# Patient Record
Sex: Female | Born: 1995
Health system: Southern US, Community
[De-identification: ages and names within clinical notes are randomized; demographics above are authoritative.]

## PROBLEM LIST (undated history)

## (undated) DIAGNOSIS — A6000 Herpesviral infection of urogenital system, unspecified: Secondary | ICD-10-CM

## (undated) DIAGNOSIS — N760 Acute vaginitis: Secondary | ICD-10-CM

## (undated) DIAGNOSIS — T7421XA Adult sexual abuse, confirmed, initial encounter: Secondary | ICD-10-CM

## (undated) DIAGNOSIS — B9689 Other specified bacterial agents as the cause of diseases classified elsewhere: Secondary | ICD-10-CM

## (undated) DIAGNOSIS — F419 Anxiety disorder, unspecified: Secondary | ICD-10-CM

## (undated) DIAGNOSIS — K259 Gastric ulcer, unspecified as acute or chronic, without hemorrhage or perforation: Secondary | ICD-10-CM

## (undated) DIAGNOSIS — L309 Dermatitis, unspecified: Secondary | ICD-10-CM

## (undated) DIAGNOSIS — M797 Fibromyalgia: Secondary | ICD-10-CM

## (undated) DIAGNOSIS — M419 Scoliosis, unspecified: Secondary | ICD-10-CM

## (undated) DIAGNOSIS — G629 Polyneuropathy, unspecified: Secondary | ICD-10-CM

## (undated) DIAGNOSIS — F32A Depression, unspecified: Secondary | ICD-10-CM

## (undated) HISTORY — DX: Depression, unspecified: F32.A

## (undated) HISTORY — DX: Anxiety disorder, unspecified: F41.9

## (undated) HISTORY — DX: Dermatitis, unspecified: L30.9

## (undated) HISTORY — PX: KNEE SURGERY: SHX244

---
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
## 2015-01-09 ENCOUNTER — Emergency Department (HOSPITAL_COMMUNITY)
Admission: EM | Admit: 2015-01-09 | Discharge: 2015-01-09 | Disposition: A | Discharge status: Home / Self Care | Payer: Medicaid Other | Attending: Emergency Medicine | Admitting: Emergency Medicine

## 2015-01-09 ENCOUNTER — Encounter (HOSPITAL_COMMUNITY): Payer: Self-pay | Admitting: Emergency Medicine

## 2015-01-09 DIAGNOSIS — Z79899 Other long term (current) drug therapy: Secondary | ICD-10-CM | POA: Diagnosis not present

## 2015-01-09 DIAGNOSIS — Z720 Tobacco use: Secondary | ICD-10-CM | POA: Insufficient documentation

## 2015-01-09 DIAGNOSIS — Z791 Long term (current) use of non-steroidal anti-inflammatories (NSAID): Secondary | ICD-10-CM | POA: Insufficient documentation

## 2015-01-09 DIAGNOSIS — M79671 Pain in right foot: Secondary | ICD-10-CM | POA: Diagnosis present

## 2015-01-09 DIAGNOSIS — M79672 Pain in left foot: Secondary | ICD-10-CM | POA: Insufficient documentation

## 2015-01-09 LAB — CBG MONITORING, ED: Glucose-Capillary: 92 mg/dL (ref 70–99)

## 2015-01-09 NOTE — ED Provider Notes (Signed)
CSN: [id number]     Arrival date & time 01/09/15  0910 History   First MD Initiated Contact with Patient 01/09/15 [phone]     Chief Complaint  Patient presents with  . Foot Pain     (Consider location/radiation/quality/duration/timing/severity/associated sxs/prior Treatment) HPI   19 year old female with intermittent and ongoing pain in both feet over the past 2 months. Patient describes pain in the ball of either foot. Comes and goes. No appreciable relieving factors. When she has the pain, it is worse with bearing weight. Prolonged activity doesn't necessarily precipitated though. No swelling. No numbness or tingling.  History reviewed. No pertinent past medical history. Past Surgical History  Procedure Laterality Date  . Knee surgery     Family History  Problem Relation Age of Onset  . Heart failure Mother   . Diabetes Mother   . Hypertension Mother   . Cancer Other    History  Substance Use Topics  . Smoking status: Current Some Day Smoker    Types: Cigarettes  . Smokeless tobacco: Never Used  . Alcohol Use: No   OB History    No data available     Review of Systems  All systems reviewed and negative, other than as noted in HPI.   Allergies  Review of patient's allergies indicates no known allergies.  Home Medications   Prior to Admission medications   Medication Sig Start Date End Date Taking? Authorizing Provider  acetaminophen (TYLENOL) 500 MG tablet Take 500 mg by mouth every 6 (six) hours as needed for headache.    Historical Provider, MD  butalbital-acetaminophen-caffeine (FIORICET, ESGIC) 50-325-40 MG per tablet Take 1 tablet by mouth 2 (two) times daily as needed for headache (headache).    Historical Provider, MD  HYDROcodone-acetaminophen (NORCO/VICODIN) 5-325 MG per tablet Take 1-2 tablets every 6 hours as needed for severe pain 11/23/14   Renne CriglerJoshua Geiple, PA-C  medroxyPROGESTERone (DEPO-PROVERA) 150 MG/ML injection Inject 150 mg into the muscle every 3  (three) months.    Historical Provider, MD  methocarbamol (ROBAXIN) 500 MG tablet Take 1 tablet (500 mg total) by mouth 4 (four) times daily. 11/23/14   Renne CriglerJoshua Geiple, PA-C  naproxen (NAPROSYN) 500 MG tablet Take 1 tablet (500 mg total) by mouth 2 (two) times daily. 12/27/14   Dione Boozeavid Glick, MD  Prenatal Vit-Fe Fumarate-FA (PRENATAL MULTIVITAMIN) TABS tablet Take 1 tablet by mouth daily at 12 noon.    Historical Provider, MD  valACYclovir (VALTREX) 1000 MG tablet Take 1,000 mg by mouth daily. as directed 12/10/14   Historical Provider, MD   There were no vitals taken for this visit. Physical Exam  Constitutional: She appears well-developed and well-nourished. No distress.  HENT:  Head: Normocephalic and atraumatic.  Eyes: Conjunctivae are normal. Right eye exhibits no discharge. Left eye exhibits no discharge.  Neck: Neck supple.  Cardiovascular: Normal rate, regular rhythm and normal heart sounds.  Exam reveals no gallop and no friction rub.   No murmur heard. Pulmonary/Chest: Effort normal and breath sounds normal. No respiratory distress.  Abdominal: Soft. She exhibits no distension. There is no tenderness.  Musculoskeletal: She exhibits no edema or tenderness.  Patient wearing what appears to be ballet slippers. Feet are grossly normal in appearance. No concerning skin changes or swelling. I cannot elicit any tenderness with palpation of either foot or ankle. No apparent pain with range of motion. Palpable DP pulses bilaterally. Feet are warm to touch. Sensation is intact to light touch. Brisk cap refill in toes.  Neurological:  She is alert.  Skin: Skin is warm and dry.  Psychiatric: She has a normal mood and affect. Her behavior is normal. Thought content normal.  Nursing note and vitals reviewed.   ED Course  Procedures (including critical care time) Labs Review Labs Reviewed  CBG MONITORING, ED    Imaging Review No results found.   EKG Interpretation None      MDM   Final  diagnoses:  Foot pain, bilateral    19yF with intermittent b/l foot pain. No acute trauma. Exam unremarkable. NVI. Imaging likely very low yield. Pt wearing foot wear similar to ballet slippers. Recommended footwear with good support, PRN NSAIDs, avoiding repetitive activities which can potentially aggravate, etc. Also addressed what to me is concerning pattern in terms of receiving controlled pain medications. Large quantities. Several different providers. Getting new prescription soon after filling another. Patient subsequently caled her mother who requested to speak with me. Her mother subsequently yelled at me. I tried explaining I addressed this as a concern and not an accusation of any kind. Mother remained upset and requested my name so she could complain.     Raeford Razor, MD 01/11/15 [phone]

## 2015-01-09 NOTE — ED Notes (Signed)
Per pt states left foot pain, ball of foot-going on for months-has been seen for it

## 2015-07-30 ENCOUNTER — Emergency Department (HOSPITAL_COMMUNITY)
Admission: EM | Admit: 2015-07-30 | Discharge: 2015-07-30 | Disposition: A | Discharge status: Home / Self Care | Payer: Medicaid Other | Attending: Emergency Medicine | Admitting: Emergency Medicine

## 2015-07-30 ENCOUNTER — Emergency Department (HOSPITAL_COMMUNITY): Payer: Medicaid Other

## 2015-07-30 ENCOUNTER — Encounter (HOSPITAL_COMMUNITY): Payer: Self-pay | Admitting: Family Medicine

## 2015-07-30 DIAGNOSIS — M25512 Pain in left shoulder: Secondary | ICD-10-CM | POA: Insufficient documentation

## 2015-07-30 DIAGNOSIS — M25561 Pain in right knee: Secondary | ICD-10-CM | POA: Insufficient documentation

## 2015-07-30 DIAGNOSIS — G8929 Other chronic pain: Secondary | ICD-10-CM | POA: Diagnosis not present

## 2015-07-30 DIAGNOSIS — Z3202 Encounter for pregnancy test, result negative: Secondary | ICD-10-CM | POA: Diagnosis not present

## 2015-07-30 DIAGNOSIS — M549 Dorsalgia, unspecified: Secondary | ICD-10-CM | POA: Insufficient documentation

## 2015-07-30 DIAGNOSIS — R52 Pain, unspecified: Secondary | ICD-10-CM

## 2015-07-30 DIAGNOSIS — Z720 Tobacco use: Secondary | ICD-10-CM | POA: Diagnosis not present

## 2015-07-30 DIAGNOSIS — M25511 Pain in right shoulder: Secondary | ICD-10-CM | POA: Diagnosis not present

## 2015-07-30 HISTORY — DX: Fibromyalgia: M79.7

## 2015-07-30 LAB — POC URINE PREG, ED: Preg Test, Ur: NEGATIVE

## 2015-07-30 LAB — CBG MONITORING, ED: Glucose-Capillary: 116 mg/dL — ABNORMAL HIGH (ref 65–99)

## 2015-07-30 MED ORDER — HYDROCODONE-ACETAMINOPHEN 5-325 MG PO TABS
1.0000 | ORAL_TABLET | Freq: Once | ORAL | Status: AC
  Administered 2015-07-30: 1 via ORAL
  Filled 2015-07-30: qty 1

## 2015-07-30 MED ORDER — METHOCARBAMOL 500 MG PO TABS
500.0000 mg | ORAL_TABLET | Freq: Once | ORAL | Status: AC
  Administered 2015-07-30: 500 mg via ORAL
  Filled 2015-07-30: qty 1

## 2015-07-30 MED ORDER — MELOXICAM 7.5 MG PO TABS: 7.5000 mg | ORAL_TABLET | Freq: Every day | ORAL | Status: DC

## 2015-07-30 NOTE — Discharge Instructions (Signed)
Cryotherapy °Cryotherapy means treatment with cold. Ice or gel packs can be used to reduce both pain and swelling. Ice is the most helpful within the first 24 to 48 hours after an injury or flare-up from overusing a muscle or joint. Sprains, strains, spasms, burning pain, shooting pain, and aches can all be eased with ice. Ice can also be used when recovering from surgery. Ice is effective, has very few side effects, and is safe for most people to use. °PRECAUTIONS  °Ice is not a safe treatment option for people with: °· Raynaud phenomenon. This is a condition affecting small blood vessels in the extremities. Exposure to cold may cause your problems to return. °· Cold hypersensitivity. There are many forms of cold hypersensitivity, including: °¨ Cold urticaria. Red, itchy hives appear on the skin when the tissues begin to warm after being iced. °¨ Cold erythema. This is a red, itchy rash caused by exposure to cold. °¨ Cold hemoglobinuria. Red blood cells break down when the tissues begin to warm after being iced. The hemoglobin that carry oxygen are passed into the urine because they cannot combine with blood proteins fast enough. °· Numbness or altered sensitivity in the area being iced. °If you have any of the following conditions, do not use ice until you have discussed cryotherapy with your caregiver: °· Heart conditions, such as arrhythmia, angina, or chronic heart disease. °· High blood pressure. °· Healing wounds or open skin in the area being iced. °· Current infections. °· Rheumatoid arthritis. °· Poor circulation. °· Diabetes. °Ice slows the blood flow in the region it is applied. This is beneficial when trying to stop inflamed tissues from spreading irritating chemicals to surrounding tissues. However, if you expose your skin to cold temperatures for too long or without the proper protection, you can damage your skin or nerves. Watch for signs of skin damage due to cold. °HOME CARE INSTRUCTIONS °Follow  these tips to use ice and cold packs safely. °· Place a dry or damp towel between the ice and skin. A damp towel will cool the skin more quickly, so you may need to shorten the time that the ice is used. °· For a more rapid response, add gentle compression to the ice. °· Ice for no more than 10 to 20 minutes at a time. The bonier the area you are icing, the less time it will take to get the benefits of ice. °· Check your skin after 5 minutes to make sure there are no signs of a poor response to cold or skin damage. °· Rest 20 minutes or more between uses. °· Once your skin is numb, you can end your treatment. You can test numbness by very lightly touching your skin. The touch should be so light that you do not see the skin dimple from the pressure of your fingertip. When using ice, most people will feel these normal sensations in this order: cold, burning, aching, and numbness. °· Do not use ice on someone who cannot communicate their responses to pain, such as small children or people with dementia. °HOW TO MAKE AN ICE PACK °Ice packs are the most common way to use ice therapy. Other methods include ice massage, ice baths, and cryosprays. Muscle creams that cause a cold, tingly feeling do not offer the same benefits that ice offers and should not be used as a substitute unless recommended by your caregiver. °To make an ice pack, do one of the following: °· Place crushed ice or a   bag of frozen vegetables in a sealable plastic bag. Squeeze out the excess air. Place this bag inside another plastic bag. Slide the bag into a pillowcase or place a damp towel between your skin and the bag.  Mix 3 parts water with 1 part rubbing alcohol. Freeze the mixture in a sealable plastic bag. When you remove the mixture from the freezer, it will be slushy. Squeeze out the excess air. Place this bag inside another plastic bag. Slide the bag into a pillowcase or place a damp towel between your skin and the bag. SEEK MEDICAL CARE  IF:  You develop white spots on your skin. This may give the skin a blotchy (mottled) appearance.  Your skin turns blue or pale.  Your skin becomes waxy or hard.  Your swelling gets worse. MAKE SURE YOU:   Understand these instructions.  Will watch your condition.  Will get help right away if you are not doing well or get worse.   This information is not intended to replace advice given to you by your health care provider. Make sure you discuss any questions you have with your health care provider.   Document Released: 05/25/2011 Document Revised: 10/19/2014 Document Reviewed: 05/25/2011 Elsevier Interactive Patient Education [employer]  Emergency Department Resource Guide 1) Find a Doctor and Pay Out of Pocket Although you won't have to find out who is covered by your insurance plan, it is a good idea to ask around and get recommendations. You will then need to call the office and see if the doctor you have chosen will accept you as a new patient and what types of options they offer for patients who are self-pay. Some doctors offer discounts or will set up payment plans for their patients who do not have insurance, but you will need to ask so you aren't surprised when you get to your appointment.  2) Contact Your Local Health Department Not all health departments have doctors that can see patients for sick visits, but many do, so it is worth a call to see if yours does. If you don't know where your local health department is, you can check in your phone book. The CDC also has a tool to help you locate your state's health department, and many state websites also have listings of all of their local health departments.  3) Find a Walk-in Clinic If your illness is not likely to be very severe or complicated, you may want to try a walk in clinic. These are popping up all over the country in pharmacies, drugstores, and shopping centers. They're usually staffed by nurse practitioners  or physician assistants that have been trained to treat common illnesses and complaints. They're usually fairly quick and inexpensive. However, if you have serious medical issues or chronic medical problems, these are probably not your best option.  No Primary Care Doctor: - Call Health Connect at  [phone] - they can help you locate a primary care doctor that  accepts your insurance, provides certain services, etc. - Physician Referral Service- [phone]  Chronic Pain Problems: Organization         Address  Phone   Notes  [facility]  [phone] Patients need to be referred by their primary care doctor.   Medication Assistance: Organization         Address  Phone   Notes  [facility] Medication [facility] [address] [place] [place], [place] [address] [phone] --Must be a  resident of [place] -- Must have NO insurance coverage whatsoever (no Medicaid/ Medicare, etc.) -- The pt. MUST have a primary care doctor that directs their care regularly and follows them in the community   [facility]ist  [phone]   [employer]  [phone]    Agencies that provide inexpensive medical care: Organization         Address  Phone   Notes  [facility]  [phone]) [phone]   [facility]    [phone]) [phone]   [facility] [address] [place], [place] [id number] [phone]   [facility] [place] [address], [place] [phone]   [facility]anned Parenthood    [phone]   [facility]    [phone]   [facility]  [address], [place] Phone:  [phone]) [phone], Fax:  [phone] Hours of Operation:  9 am - 6 pm, M-F.  Also accepts Medicaid/Medicare and self-pay.  [facility]  [address], [place] Phone: [phone], Fax: [phone]. Hours of Operation:  8:30 am - 5:30 pm, M-F.   Also accepts Medicaid and self-pay.  [facility] [place] [address], [place] Phone: [phone]) [phone]   [facility] [address], [place], [place] [phone], Ext. [phone] Mondays & Thursdays: 7-9 AM.  First 15 patients are seen on a first come, first serve basis.    Medicaid-accepting [facility] Providers:  Organization         Address  Phone   Notes  [facility] [address], [place] [phone] Also accepts self-pay patients.  [facility] [address], [place]  [phone]) [phone]   [facility] [address], [place] [phone]) [phone]   [facility] [address] [place] [address], [place] [phone]) [phone]   Renaye RakersVeita Bland [address], [place]   [phone] Only accepts [place] Access [place] patients after they have their name applied to their card.   Self-Pay (no insurance) in [place]:  Organization         Address  Phone   Notes  Sickle Cell Patients, [facility] [address], [place] [phone]) [phone]   [facility] [address], [place] [phone]) [phone]   [facility] [place]  [address], [place] [phone]) [phone]   [facility]/Dr. Osei-Bonsu  [address] [place] [address], [place] or [phone], [place] [phone] Phone number for both [place] and [place] locations is the same.  [facility] [address], [place] [phone]) [phone]   [facility] [address] [place] [address], [place] or [address] [phone]   [facility] [address], [place] [phone]) [phone], phone; [phone]) [phone], fax Sees patients 1st and 3rd Saturday of every month.  Must not qualify for public or private insurance (i.e. Medicaid, Medicare, [place] Health Choice, Veterans'  Benefits)  Household income should be no more than 200% of the poverty level The clinic cannot treat you if you are pregnant or think you are pregnant  Sexually transmitted diseases are not treated at the clinic.    Dental Care: Organization  Address  Phone  Notes  [facility] [address] [place], [place] [phone] Accepts children up to age 66 who are enrolled in [place] or [place] Health Choice; pregnant women with a Medicaid card; and children who have applied for Medicaid or [place] Health Choice, but were declined, whose parents can pay a reduced fee at time of service.  [facility]  [address], [place] [phone] Accepts children up to age 59 who are enrolled in [place] or [place] Health Choice; pregnant women with a Medicaid card; and children who have applied for Medicaid or  Health Choice, but were declined, whose parents can pay a reduced fee at time of service.  [facility]  [address] [place], [place] [phone] Patients are seen by appointment only. Walk-ins are not accepted. [facility] will see patients 64 years of age and older. Monday - Tuesday (8am-5pm) Most Wednesdays (8:30-5pm) $30 per visit, cash only  [facility] Adult [facility] Access [facility]  [address], [facility] [phone] Patients are seen by appointment only. Walk-ins are not accepted. [facility] will see patients 51 years of age and older. One Wednesday Evening (Monthly: Volunteer Based).  $30 per visit, cash only  [employer]  [phone] for adults; Children under age 21, call Graduate Pediatric Dentistry at [phone]. Children aged 55-14, please call [phone] to request a pediatric application.  Dental services are provided in all areas of dental care including fillings, crowns and bridges, complete and partial  dentures, implants, gum treatment, root canals, and extractions. Preventive care is also provided. Treatment is provided to both adults and children. Patients are selected via a lottery and there is often a waiting list.   [facility] [address] [place]  [phone] [email or web address]   [facility] [address] [place], [place] [phone], Ext. [phone] Second and Fourth Thursday of each month, opens at 6:30 AM; Clinic ends at 9 AM.  Patients are seen on a first-come first-served basis, and a limited number are seen during each clinic.   [facility]  [address] [place], [place] [phone]   Eligibility Requirements You must have lived in [place], [place], or [place] counties for at least the last three months.   You cannot be eligible for state or federal sponsored [place], including [employer], [place], or [employer].   You generally cannot be eligible for healthcare insurance through your employer.    How to apply: Eligibility screenings are held every Tuesday and Wednesday afternoon from 1:00 pm until 4:00 pm. You do not need an appointment for the interview!  [facility] [address] [place], [place] [phone]   [facility] Health Department  [phone]   [facility] Health Department  [phone]   [facility] Health Department  [phone]    Behavioral Health Resources in the Community: Intensive Outpatient Programs Organization         Address  Phone  Notes  [facility] [address] [place], [place] [phone]   [facility] Outpatient [address], [place], [place] [phone]   [facility] [address], [place], [place]  [phone]   [facility] [address],  [place], [place] [phone] or [phone]   Substance Abuse Resources Organization  Address  Phone  Notes  [facility]  [phone]   [facility]  [phone]   [facility] [place]  [phone]   Floydene Flock  [phone]   Residential & Outpatient Substance Abuse Program  [phone]   Psychological Services Organization         Address  Phone  Notes  [facility] Behavioral Health  [phone]   [facility] Services  [phone]   [facility] [address] [place] [phone] or [phone]    Mobile Crisis Teams Organization         Address  Phone  Notes  [facility], Mobile Crisis Care Unit  [phone]   [facility]  [address] [place], [place] [phone]   Doristine Locks [address] [place] [phone]    Self-Help/Support Groups Organization         Address  Phone             Notes  [facility] [place] - variety of support groups  [phone]- I7437963 Call for more information  Narcotics Anonymous (NA), [facility] [address], [employer] [place]  2 meetings at this location   Statistician         Address  Phone  Notes  [facility] [address],    [place]  [phone]   [facility]  [address] [place] [address], [place], [place] [phone]   [facility] [address] [place], [place] [phone] Admissions: 8am-3pm M-F  [facility] [address]    [place], [place] [phone]   [facility] [address] [place], [place], [place] [phone]   [facility] [address]  [place], [place] [phone]   [facility] - Intensive Outpatient [address], [place], [place] [phone]   [facility] ([facility] [address] [place]  [place], [place] [phone] or [phone]   [facility] ([facility]) [address] [place], [place]  [phone] Accepts Medicaid  [facility] [place] [facility] [address]  [place] [phone] Substance Abuse/Addiction Treatment   [facility] Organization         Address  Phone  Notes  [facility]  [phone]   Angie Fava, PhD [address] [place], [place]   [phone] or [phone]   [facility]   [address] [place], [place] [phone]   [facility] [address] [place], [place] [phone] Insurance/Medicaid/sponsorship through [facility] and Families [address]                                    [place], [place] [phone] Therapy/tele-psych/case  [facility] [address], [place] [phone]    Dr. Lolly Mustache  [phone]   [facility] [place]  [employer] [facility] Dept. 1) [address], [place] 2) [address] [place] 3)  [address], [place] [phone]  [phone]   [facility] Child Abuse Hotline 4696492381 or [phone] (After Hours)
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
## 2015-11-02 ENCOUNTER — Encounter (HOSPITAL_COMMUNITY): Payer: Self-pay | Admitting: Emergency Medicine

## 2015-11-02 ENCOUNTER — Emergency Department (HOSPITAL_COMMUNITY)
Admission: EM | Admit: 2015-11-02 | Discharge: 2015-11-02 | Disposition: A | Discharge status: Home / Self Care | Payer: Medicaid Other | Attending: Physician Assistant | Admitting: Physician Assistant

## 2015-11-02 DIAGNOSIS — F17210 Nicotine dependence, cigarettes, uncomplicated: Secondary | ICD-10-CM | POA: Insufficient documentation

## 2015-11-02 DIAGNOSIS — M545 Low back pain, unspecified: Secondary | ICD-10-CM

## 2015-11-02 DIAGNOSIS — R0981 Nasal congestion: Secondary | ICD-10-CM | POA: Diagnosis not present

## 2015-11-02 DIAGNOSIS — Z791 Long term (current) use of non-steroidal anti-inflammatories (NSAID): Secondary | ICD-10-CM | POA: Insufficient documentation

## 2015-11-02 DIAGNOSIS — M797 Fibromyalgia: Secondary | ICD-10-CM | POA: Diagnosis not present

## 2015-11-02 DIAGNOSIS — Z3202 Encounter for pregnancy test, result negative: Secondary | ICD-10-CM | POA: Insufficient documentation

## 2015-11-02 DIAGNOSIS — M549 Dorsalgia, unspecified: Secondary | ICD-10-CM | POA: Diagnosis present

## 2015-11-02 DIAGNOSIS — G8929 Other chronic pain: Secondary | ICD-10-CM | POA: Diagnosis not present

## 2015-11-02 DIAGNOSIS — R5383 Other fatigue: Secondary | ICD-10-CM | POA: Diagnosis not present

## 2015-11-02 LAB — CBC
HEMATOCRIT: 39.4 % (ref 36.0–46.0)
Hemoglobin: 13 g/dL (ref 12.0–15.0)
MCH: 28.7 pg (ref 26.0–34.0)
MCHC: 33 g/dL (ref 30.0–36.0)
MCV: 87 fL (ref 78.0–100.0)
PLATELETS: 261 10*3/uL (ref 150–400)
RBC: 4.53 MIL/uL (ref 3.87–5.11)
RDW: 12.1 % (ref 11.5–15.5)
WBC: 9.9 10*3/uL (ref 4.0–10.5)

## 2015-11-02 LAB — URINALYSIS, ROUTINE W REFLEX MICROSCOPIC
Glucose, UA: NEGATIVE mg/dL
Hgb urine dipstick: NEGATIVE
KETONES UR: NEGATIVE mg/dL
LEUKOCYTES UA: NEGATIVE
NITRITE: NEGATIVE
PROTEIN: NEGATIVE mg/dL
Specific Gravity, Urine: 1.036 — ABNORMAL HIGH (ref 1.005–1.030)
pH: 6 (ref 5.0–8.0)

## 2015-11-02 LAB — BASIC METABOLIC PANEL
Anion gap: 9 (ref 5–15)
BUN: 8 mg/dL (ref 6–20)
CO2: 27 mmol/L (ref 22–32)
CREATININE: 0.79 mg/dL (ref 0.44–1.00)
Calcium: 9.6 mg/dL (ref 8.9–10.3)
Chloride: 107 mmol/L (ref 101–111)
GFR calc Af Amer: >60 mL/min (ref >60)
GFR calc non Af Amer: >60 mL/min (ref >60)
Glucose, Bld: 116 mg/dL — ABNORMAL HIGH (ref 65–99)
POTASSIUM: 4.2 mmol/L (ref 3.5–5.1)
Sodium: 143 mmol/L (ref 135–145)

## 2015-11-02 LAB — I-STAT BETA HCG BLOOD, ED (MC, WL, AP ONLY)

## 2015-11-02 MED ORDER — CYCLOBENZAPRINE HCL 10 MG PO TABS: 10.0000 mg | ORAL_TABLET | Freq: Two times a day (BID) | ORAL | Status: DC | PRN

## 2015-11-02 MED ORDER — IBUPROFEN 800 MG PO TABS: 800.0000 mg | ORAL_TABLET | Freq: Three times a day (TID) | ORAL | Status: DC

## 2015-11-02 MED ORDER — IBUPROFEN 800 MG PO TABS
800.0000 mg | ORAL_TABLET | Freq: Once | ORAL | Status: AC
  Administered 2015-11-02: 800 mg via ORAL
  Filled 2015-11-02: qty 1

## 2015-11-02 NOTE — Discharge Instructions (Signed)

## 2015-11-02 NOTE — ED Notes (Addendum)
Pt reports lower back pain that switches sides since 2013 that has gotten worse lately. Pt also reports runny nose, intermittent cough, SOB (speaking in full sentences with no difficulty) and generalized weakness.

## 2015-11-02 NOTE — ED Provider Notes (Signed)
CSN: [id number]     Arrival date & time 11/02/15  1334 History   First MD Initiated Contact with Patient 11/02/15 1614     Chief Complaint  Patient presents with  . Back Pain  . Fatigue     (Consider location/radiation/quality/duration/timing/severity/associated sxs/prior Treatment) HPI   Patient is a 20 year old female with fibromyalgia and chronic back pain presenting with back pain today. Patient reports that she has back pain flares every once while. She reports it is muscular in nature. She reports is worse with moving to the right or left. She denies any fever. She says she sometimes gets short of breath which is related to a cold that she has today. She's had 2-3 days of congestion.   No fevers eating and drinking normally.   Past Medical History  Diagnosis Date  . Fibromyalgia    Past Surgical History  Procedure Laterality Date  . Knee surgery    . Knee surgery Right    Family History  Problem Relation Age of Onset  . Heart failure Mother   . Diabetes Mother   . Hypertension Mother   . Cancer Other    Social History  Substance Use Topics  . Smoking status: Current Some Day Smoker    Types: Cigarettes  . Smokeless tobacco: Never Used  . Alcohol Use: No   OB History    No data available     Review of Systems  Constitutional: Negative for activity change.  HENT: Positive for congestion.   Respiratory: Negative for shortness of breath.   Cardiovascular: Negative for chest pain.  Gastrointestinal: Negative for abdominal pain.  Musculoskeletal: Positive for back pain.      Allergies  Review of patient's allergies indicates no known allergies.  Home Medications   Prior to Admission medications   Medication Sig Start Date End Date Taking? Authorizing Provider  butalbital-acetaminophen-caffeine (FIORICET, ESGIC) 50-325-40 MG per tablet Take 1 tablet by mouth 2 (two) times daily as needed for headache (headache).   Yes Historical Provider, MD  gabapentin  (NEURONTIN) 300 MG capsule take 1 capsule by mouth every 8 hours 07/31/15  Yes Historical Provider, MD  HYDROcodone-acetaminophen (NORCO/VICODIN) 5-325 MG per tablet Take 1-2 tablets every 6 hours as needed for severe pain 11/23/14  Yes Renne Crigler, PA-C  medroxyPROGESTERone (DEPO-PROVERA) 150 MG/ML injection Inject 150 mg into the muscle every 3 (three) months.   Yes Historical Provider, MD  meloxicam (MOBIC) 7.5 MG tablet Take 1 tablet (7.5 mg total) by mouth daily. 07/30/15  Yes April Palumbo, MD  valACYclovir (VALTREX) 1000 MG tablet Take 1,000 mg by mouth daily as needed (prevent infection).  12/10/14  Yes Historical Provider, MD  acetaminophen (TYLENOL) 500 MG tablet Take 500 mg by mouth every 6 (six) hours as needed for headache.    Historical Provider, MD  methocarbamol (ROBAXIN) 500 MG tablet Take 1 tablet (500 mg total) by mouth 4 (four) times daily. Patient not taking: Reported on 07/30/2015 11/23/14   Renne Crigler, PA-C  naproxen (NAPROSYN) 500 MG tablet Take 1 tablet (500 mg total) by mouth 2 (two) times daily. Patient not taking: Reported on 07/30/2015 12/27/14   Dione Booze, MD   BP 115/76 mmHg  Pulse 86  Temp(Src) 98.4 F (36.9 C) (Oral)  Resp 16  SpO2 100% Physical Exam  Constitutional: She is oriented to person, place, and time. She appears well-developed and well-nourished.  HENT:  Head: Normocephalic and atraumatic.  Eyes: Right eye exhibits no discharge.  Cardiovascular: Normal rate, regular  rhythm and normal heart sounds.   No murmur heard. Pulmonary/Chest: Effort normal and breath sounds normal. She has no wheezes. She has no rales.  Musculoskeletal: Normal range of motion.  Mild tenderness to right paraspinal area in the lumbar  Neurological: She is oriented to person, place, and time.  Skin: Skin is warm and dry. She is not diaphoretic.  Psychiatric: She has a normal mood and affect.  Nursing note and vitals reviewed.   ED Course  Procedures (including  critical care time) Labs Review Labs Reviewed  BASIC METABOLIC PANEL - Abnormal; Notable for the following:    Glucose, Bld 116 (*)    All other components within normal limits  URINALYSIS, ROUTINE W REFLEX MICROSCOPIC (NOT AT [facility]) - Abnormal; Notable for the following:    Color, Urine AMBER (*)    Specific Gravity, Urine 1.036 (*)    Bilirubin Urine SMALL (*)    All other components within normal limits  CBC  I-STAT BETA HCG BLOOD, ED ([facility], [facility], [facility] ONLY)    Imaging Review No results found. I have personally reviewed and evaluated these images and lab results as part of my medical decision-making.   EKG Interpretation None      MDM   Final diagnoses:  None   patient 20 year old female with history of fibromyalgia and chronic back pain presenting with back pain . Patient reports that she has a little bit increased pain in her back. She reports that it is made her have a hard time at work.   She has normal range of motion. Appears in her normal state of health. We will give her ibuprofen Flexeril and work note.  Shakti Fleer Randall An, MD 11/02/15 1654

## 2016-06-30 ENCOUNTER — Emergency Department (HOSPITAL_COMMUNITY)
Admission: EM | Admit: 2016-06-30 | Discharge: 2016-06-30 | Disposition: A | Discharge status: Home / Self Care | Payer: Medicaid Other | Attending: Emergency Medicine | Admitting: Emergency Medicine

## 2016-06-30 ENCOUNTER — Encounter (HOSPITAL_COMMUNITY): Payer: Self-pay | Admitting: *Deleted

## 2016-06-30 ENCOUNTER — Emergency Department (HOSPITAL_COMMUNITY): Payer: Medicaid Other

## 2016-06-30 ENCOUNTER — Ambulatory Visit (HOSPITAL_COMMUNITY)
Admission: EM | Admit: 2016-06-30 | Discharge: 2016-06-30 | Disposition: A | Discharge status: Nursing Home (Includes ICF) | Payer: Medicaid Other | Attending: Emergency Medicine | Admitting: Emergency Medicine

## 2016-06-30 DIAGNOSIS — F17210 Nicotine dependence, cigarettes, uncomplicated: Secondary | ICD-10-CM | POA: Insufficient documentation

## 2016-06-30 DIAGNOSIS — R300 Dysuria: Secondary | ICD-10-CM | POA: Diagnosis present

## 2016-06-30 DIAGNOSIS — N390 Urinary tract infection, site not specified: Secondary | ICD-10-CM

## 2016-06-30 DIAGNOSIS — R1011 Right upper quadrant pain: Secondary | ICD-10-CM

## 2016-06-30 LAB — CBC
HEMATOCRIT: 38.6 % (ref 36.0–46.0)
HEMOGLOBIN: 11.9 g/dL — AB (ref 12.0–15.0)
MCH: 28.5 pg (ref 26.0–34.0)
MCHC: 30.8 g/dL (ref 30.0–36.0)
MCV: 92.3 fL (ref 78.0–100.0)
Platelets: 234 10*3/uL (ref 150–400)
RBC: 4.18 MIL/uL (ref 3.87–5.11)
RDW: 13.7 % (ref 11.5–15.5)
WBC: 12 10*3/uL — ABNORMAL HIGH (ref 4.0–10.5)

## 2016-06-30 LAB — LIPASE, BLOOD: Lipase: 22 U/L (ref 11–51)

## 2016-06-30 LAB — URINE MICROSCOPIC-ADD ON

## 2016-06-30 LAB — URINALYSIS, ROUTINE W REFLEX MICROSCOPIC
Bilirubin Urine: NEGATIVE
Glucose, UA: NEGATIVE mg/dL
Ketones, ur: NEGATIVE mg/dL
NITRITE: POSITIVE — AB
PH: 5.5 (ref 5.0–8.0)
Protein, ur: 30 mg/dL — AB
Specific Gravity, Urine: 1.021 (ref 1.005–1.030)

## 2016-06-30 LAB — POCT URINALYSIS DIP (DEVICE)
BILIRUBIN URINE: NEGATIVE
GLUCOSE, UA: 250 mg/dL — AB
Nitrite: POSITIVE — AB
Protein, ur: 100 mg/dL — AB
Specific Gravity, Urine: 1.01 (ref 1.005–1.030)
UROBILINOGEN UA: 2 mg/dL — AB (ref 0.0–1.0)
pH: 5 (ref 5.0–8.0)

## 2016-06-30 LAB — COMPREHENSIVE METABOLIC PANEL
ALBUMIN: 4 g/dL (ref 3.5–5.0)
ALT: 22 U/L (ref 14–54)
AST: 29 U/L (ref 15–41)
Alkaline Phosphatase: 102 U/L (ref 38–126)
Anion gap: 8 (ref 5–15)
BUN: 11 mg/dL (ref 6–20)
CALCIUM: 9.1 mg/dL (ref 8.9–10.3)
CO2: 22 mmol/L (ref 22–32)
Chloride: 111 mmol/L (ref 101–111)
Creatinine, Ser: 0.8 mg/dL (ref 0.44–1.00)
GFR calc Af Amer: >60 mL/min (ref >60)
GFR calc non Af Amer: >60 mL/min (ref >60)
GLUCOSE: 110 mg/dL — AB (ref 65–99)
POTASSIUM: 4.7 mmol/L (ref 3.5–5.1)
SODIUM: 141 mmol/L (ref 135–145)
TOTAL PROTEIN: 6.8 g/dL (ref 6.5–8.1)
Total Bilirubin: 1 mg/dL (ref 0.3–1.2)

## 2016-06-30 LAB — POCT PREGNANCY, URINE: PREG TEST UR: NEGATIVE

## 2016-06-30 MED ORDER — KETOROLAC TROMETHAMINE 60 MG/2ML IM SOLN
INTRAMUSCULAR | Status: AC
  Filled 2016-06-30: qty 2

## 2016-06-30 MED ORDER — SULFAMETHOXAZOLE-TRIMETHOPRIM 800-160 MG PO TABS: 1.0000 | ORAL_TABLET | Freq: Two times a day (BID) | ORAL | 0 refills | Status: AC

## 2016-06-30 MED ORDER — MORPHINE SULFATE (PF) 2 MG/ML IV SOLN
INTRAVENOUS | Status: AC
  Filled 2016-06-30: qty 2

## 2016-06-30 MED ORDER — DEXTROSE 5 % IV SOLN
1.0000 g | Freq: Once | INTRAVENOUS | Status: AC
  Administered 2016-06-30: 1 g via INTRAVENOUS
  Filled 2016-06-30: qty 10

## 2016-06-30 MED ORDER — KETOROLAC TROMETHAMINE 60 MG/2ML IM SOLN
60.0000 mg | Freq: Once | INTRAMUSCULAR | Status: AC
  Administered 2016-06-30: 60 mg via INTRAMUSCULAR

## 2016-06-30 MED ORDER — METHOCARBAMOL 750 MG PO TABS: 750.0000 mg | ORAL_TABLET | Freq: Four times a day (QID) | ORAL | 0 refills | Status: DC

## 2016-06-30 MED ORDER — IOPAMIDOL (ISOVUE-300) INJECTION 61%
INTRAVENOUS | Status: AC
  Administered 2016-06-30: 100 mL via INTRAVENOUS
  Filled 2016-06-30: qty 100

## 2016-06-30 MED ORDER — MORPHINE SULFATE (PF) 4 MG/ML IV SOLN
6.0000 mg | Freq: Once | INTRAVENOUS | Status: AC
Start: 2016-06-30 — End: 2016-06-30
  Administered 2016-06-30: 6 mg via INTRAVENOUS
  Filled 2016-06-30: qty 2

## 2016-06-30 MED ORDER — MORPHINE SULFATE (PF) 2 MG/ML IV SOLN
4.0000 mg | Freq: Once | INTRAVENOUS | Status: AC
  Administered 2016-06-30: 4 mg via INTRAMUSCULAR

## 2016-06-30 MED ORDER — OXYCODONE-ACETAMINOPHEN 5-325 MG PO TABS
1.0000 | ORAL_TABLET | Freq: Once | ORAL | Status: AC
  Administered 2016-06-30: 1 via ORAL
  Filled 2016-06-30: qty 1

## 2016-06-30 NOTE — ED Triage Notes (Signed)
Pt  Reports   Symptoms  Of  Back  Pain  r  Side   Scanty  Urinary  Output     History   Of uti  In  Past

## 2016-06-30 NOTE — ED Provider Notes (Signed)
[facility]-EMERGENCY DEPT Provider Note   CSN: [id number] Arrival date & time: 06/30/16  1646     History   Chief Complaint Chief Complaint  Patient presents with  . Abdominal Pain    HPI Stephanie Bean is a 20 y.o. female.  HPI   20 year old female with history of fibromyalgia sent here from urgent care for evaluation of dysuria. Patient report having urinary frequency, urgency, burning urination with strong odor for the past several days. She also report having abdominal pain to the suprapubic region and right upper quadrant for the same duration. Pain is worsened with movement with associated low back pain but no postprandial pain. No associated fever nausea or vomiting bowel changes. Denies having chest pain shortness of breath productive cough or myalgias. She reported taking Tylenol with minimal improvement. Report having any sexual partner who is asymptomatic and she voiced no concern for STD. She is sent here from urgent care because she has signs of urinary tract infection along with hemoglobin, proteinuria and glucosuria concerning for bilateral. Furthermore she also has right upper quadrant tendernessAnd right flank tenderness. She denies any postprandial pain. She denies prior history of kidney stone. She received Toradol, and morphine prior to arrival but states the pain is still moderate to severe. She takes Norco 10/325 on a chronic basis from her PCP for her fibromyalgia.  Past Medical History:  Diagnosis Date  . Fibromyalgia     There are no active problems to display for this patient.   Past Surgical History:  Procedure Laterality Date  . KNEE SURGERY    . KNEE SURGERY Right     OB History    No data available       Home Medications    Prior to Admission medications   Medication Sig Start Date End Date Taking? Authorizing Provider  acetaminophen (TYLENOL) 500 MG tablet Take 500 mg by mouth every 6 (six) hours as needed for headache.    Historical Provider, MD    butalbital-acetaminophen-caffeine (FIORICET, ESGIC) 50-325-40 MG per tablet Take 1 tablet by mouth 2 (two) times daily as needed for headache (headache).    Historical Provider, MD  cyclobenzaprine (FLEXERIL) 10 MG tablet Take 1 tablet (10 mg total) by mouth 2 (two) times daily as needed for muscle spasms. 11/02/15   Courteney Lyn Mackuen, MD  gabapentin (NEURONTIN) 300 MG capsule take 1 capsule by mouth every 8 hours 07/31/15   Historical Provider, MD  HYDROcodone-acetaminophen (NORCO/VICODIN) 5-325 MG per tablet Take 1-2 tablets every 6 hours as needed for severe pain 11/23/14   Renne CriglerJoshua Geiple, PA-C  ibuprofen (ADVIL,MOTRIN) 800 MG tablet Take 1 tablet (800 mg total) by mouth 3 (three) times daily. 11/02/15   Courteney Lyn Mackuen, MD  MedroxyPROGESTERone Acetate 150 MG/ML SUSY Inject 150 mg into the muscle every 3 (three) months. Reported on 11/02/2015 10/29/15   Historical Provider, MD  meloxicam (MOBIC) 7.5 MG tablet Take 1 tablet (7.5 mg total) by mouth daily. 07/30/15   April Palumbo, MD  methocarbamol (ROBAXIN) 500 MG tablet Take 1 tablet (500 mg total) by mouth 4 (four) times daily. Patient not taking: Reported on 07/30/2015 11/23/14   Renne CriglerJoshua Geiple, PA-C  naproxen (NAPROSYN) 500 MG tablet Take 1 tablet (500 mg total) by mouth 2 (two) times daily. Patient not taking: Reported on 07/30/2015 12/27/14   Dione Boozeavid Glick, MD  valACYclovir (VALTREX) 500 MG tablet Take 500 mg by mouth 2 (two) times daily. 09/20/15   Historical Provider, MD    Family History Family  History  Problem Relation Age of Onset  . Heart failure Mother   . Diabetes Mother   . Hypertension Mother   . Cancer Other     Social History Social History  Substance Use Topics  . Smoking status: Current Some Day Smoker    Types: Cigarettes  . Smokeless tobacco: Never Used  . Alcohol use No     Allergies   Review of patient's allergies indicates no known allergies.   Review of Systems Review of Systems  All other systems  reviewed and are negative.    Physical Exam Updated Vital Signs BP 127/69 (BP Location: Left Arm)   Pulse 90   Temp 98.9 F (37.2 C) (Oral)   Resp 20   Ht 5\' 8"  (1.727 m)   Wt 81.6 kg   SpO2 100%   BMI 27.37 kg/m   Physical Exam  Constitutional: She appears well-developed and well-nourished. No distress.  Patient is well-appearing, laying in bed in no acute discomfort.  HENT:  Head: Atraumatic.  Eyes: Conjunctivae are normal.  Neck: Neck supple.  Cardiovascular: Normal rate and regular rhythm.   Pulmonary/Chest: Effort normal and breath sounds normal.  Abdominal: Soft. She exhibits no distension. There is tenderness (Mild suprapubic tenderness, and tenderness right flank on percussion. Negative Murphy sign no pain at McBurney's point.).  Neurological: She is alert.  Skin: No rash noted.  Psychiatric: She has a normal mood and affect.  Nursing note and vitals reviewed.    ED Treatments / Results  Labs (all labs ordered are listed, but only abnormal results are displayed) Labs Reviewed  COMPREHENSIVE METABOLIC PANEL - Abnormal; Notable for the following:       Result Value   Glucose, Bld 110 (*)    All other components within normal limits  CBC - Abnormal; Notable for the following:    WBC 12.0 (*)    Hemoglobin 11.9 (*)    All other components within normal limits  URINALYSIS, ROUTINE W REFLEX MICROSCOPIC (NOT AT [facility]) - Abnormal; Notable for the following:    Color, Urine ORANGE (*)    APPearance CLOUDY (*)    Hgb urine dipstick SMALL (*)    Protein, ur 30 (*)    Nitrite POSITIVE (*)    Leukocytes, UA LARGE (*)    All other components within normal limits  URINE MICROSCOPIC-ADD ON - Abnormal; Notable for the following:    Squamous Epithelial / LPF 6-30 (*)    Bacteria, UA MANY (*)    All other components within normal limits  URINE CULTURE  LIPASE, BLOOD    EKG  EKG Interpretation None       Radiology Ct Abdomen Pelvis W Contrast  Result Date:  06/30/2016 CLINICAL DATA:  Acute lower abdominal pain and right posterior flank pain. Associated nausea. EXAM: CT ABDOMEN AND PELVIS WITH CONTRAST TECHNIQUE: Multidetector CT imaging of the abdomen and pelvis was performed using the standard protocol following bolus administration of intravenous contrast. CONTRAST:  ISOVUE-300 IOPAMIDOL (ISOVUE-300) INJECTION 61% COMPARISON:  12/07/2015 abdominal radiographs FINDINGS: Lower chest: No acute abnormality. Minor left basilar atelectasis dependently. Normal heart size. No pericardial or pleural effusion. Hepatobiliary: No focal liver abnormality is seen. No gallstones, gallbladder wall thickening, or biliary dilatation. Pancreas: Unremarkable. No pancreatic ductal dilatation or surrounding inflammatory changes. Spleen: Normal in size without focal abnormality. Adrenals/Urinary Tract: Adrenal glands are unremarkable. Kidneys are normal, without renal calculi, focal lesion, or hydronephrosis. No CT evidence of pyelonephritis. Bladder is unremarkable. Stomach/Bowel: Stomach is  within normal limits. Appendix contains hyperdense stool material without CT evidence of appendicitis. Suspect developing appendicoliths. No evidence of bowel wall thickening, distention, or inflammatory changes. Vascular/Lymphatic: No significant vascular findings are present. No enlarged abdominal or pelvic lymph nodes. Reproductive: Uterus and bilateral adnexa are unremarkable. Other: No inguinal abnormality or hernia.  Intact abdominal wall. Trace pelvic free fluid likely physiologic. Musculoskeletal: No acute or significant osseous findings. IMPRESSION: No acute intra-abdominal or pelvic process by CT. No CT evidence of pyelonephritis. No intra-abdominal abscess or fluid collection. Electronically Signed   By: Judie Petit.  Shick M.D.   On: 06/30/2016 22:09    Procedures Procedures (including critical care time)  Medications Ordered in ED Medications  morphine 4 MG/ML injection 6 mg (6 mg  Intravenous Given 06/30/16 2039)  iopamidol (ISOVUE-300) 61 % injection (100 mLs Intravenous Contrast Given 06/30/16 2145)  cefTRIAXone (ROCEPHIN) 1 g in dextrose 5 % 50 mL IVPB (1 g Intravenous New Bag/Given 06/30/16 2223)  oxyCODONE-acetaminophen (PERCOCET/ROXICET) 5-325 MG per tablet 1 tablet (1 tablet Oral Given 06/30/16 2222)     Initial Impression / Assessment and Plan / ED Course  I have reviewed the triage vital signs and the nursing notes.  Pertinent labs & imaging results that were available during my care of the patient were reviewed by me and considered in my medical decision making (see chart for details).  Clinical Course    BP 127/69 (BP Location: Left Arm)   Pulse 90   Temp 98.9 F (37.2 C) (Oral)   Resp 20   Ht 5\' 8"  (1.727 m)   Wt 81.6 kg   SpO2 100%   BMI 27.37 kg/m    Final Clinical Impressions(s) / ED Diagnoses   Final diagnoses:  UTI (lower urinary tract infection)    New Prescriptions New Prescriptions   SULFAMETHOXAZOLE-TRIMETHOPRIM (BACTRIM DS,SEPTRA DS) 800-160 MG TABLET    Take 1 tablet by mouth 2 (two) times daily.   7:20 PM Patient here with dysuria, positive UTI on urinalysis at the urgent care today Sent here for further evaluation of abdominal pain. Patient states she is not concern for  STI since she recently was checked. She is well appearing. Low suspicion for biliary disease causing her symptoms.  Will obtain abd/pelvis CT for further eval.    10:55 PM UA was signs consistence with a urinary tract infection. Urine culture sent. Pt given Rocephin in the ER. Her preg test from [facility] is negative.  CT shows no acute finding.  Pt will be treated for a UTI with bactrim.  STD screening was not performed as pt doesn't want a pelvic exam.  Pt will f/u with PCP for further care.  Return precaution discussed.    Fayrene Helper, PA-C 06/30/16 2317    Alvira Monday, MD 07/01/16 [phone]

## 2016-06-30 NOTE — ED Notes (Signed)
Report  Phoned  To  eric

## 2016-06-30 NOTE — ED Triage Notes (Signed)
Pt is here with lower abdominal pain and right lateral and right posterior lower back pain.  [facility]. Concern for pyelo or gallbladder disease or diverticulitis.  Tordal and Morphine given at Urgent Care
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
## 2016-06-30 NOTE — ED Notes (Signed)
Pt    Advised   Npo   

## 2016-07-03 LAB — URINE CULTURE

## 2016-07-04 ENCOUNTER — Telehealth (HOSPITAL_BASED_OUTPATIENT_CLINIC_OR_DEPARTMENT_OTHER): Payer: Self-pay

## 2016-07-04 NOTE — Telephone Encounter (Signed)
Post ED Visit - Positive Culture Follow-up  Culture report reviewed by antimicrobial stewardship pharmacist:  []  Enzo BiNathan Batchelder, Pharm.D. []  Celedonio MiyamotoJeremy Frens, Pharm.D., BCPS []  Garvin FilaMike Maccia, Pharm.D. []  Georgina PillionElizabeth Martin, Pharm.D., BCPS []  WhartonMinh Pham, [address]Ph[address]D., BCPS, AAHIVP []  Estella HuskMichelle Turner, Pharm.D., BCPS, AAHIVP []  Cassie Stewart, Pharm.D. []  Rob Oswaldo DoneVincent, [address] Rachel Rumsbarger Pharm D Positive urine culture Treated with Sulfamethoxazole-Trimethoprim, organism sensitive to the same and no further patient follow-up is required at this time.  Jerry CarasCullom, Ranon Coven Burnett 07/04/2016, 10:06 AM

## 2016-09-07 ENCOUNTER — Emergency Department (HOSPITAL_COMMUNITY): Payer: Medicaid Other

## 2016-09-07 ENCOUNTER — Emergency Department (HOSPITAL_COMMUNITY)
Admission: EM | Admit: 2016-09-07 | Discharge: 2016-09-07 | Discharge status: Absent without leave | Payer: Medicaid Other

## 2016-09-07 ENCOUNTER — Emergency Department (HOSPITAL_COMMUNITY)
Admission: EM | Admit: 2016-09-07 | Discharge: 2016-09-07 | Disposition: A | Discharge status: Home / Self Care | Payer: Medicaid Other | Attending: Emergency Medicine | Admitting: Emergency Medicine

## 2016-09-07 DIAGNOSIS — Y939 Activity, unspecified: Secondary | ICD-10-CM | POA: Insufficient documentation

## 2016-09-07 DIAGNOSIS — R0789 Other chest pain: Secondary | ICD-10-CM | POA: Insufficient documentation

## 2016-09-07 DIAGNOSIS — M25512 Pain in left shoulder: Secondary | ICD-10-CM | POA: Insufficient documentation

## 2016-09-07 DIAGNOSIS — Y929 Unspecified place or not applicable: Secondary | ICD-10-CM | POA: Insufficient documentation

## 2016-09-07 DIAGNOSIS — Y999 Unspecified external cause status: Secondary | ICD-10-CM | POA: Diagnosis not present

## 2016-09-07 DIAGNOSIS — R109 Unspecified abdominal pain: Secondary | ICD-10-CM | POA: Insufficient documentation

## 2016-09-07 DIAGNOSIS — F17210 Nicotine dependence, cigarettes, uncomplicated: Secondary | ICD-10-CM | POA: Diagnosis not present

## 2016-09-07 DIAGNOSIS — M25561 Pain in right knee: Secondary | ICD-10-CM | POA: Insufficient documentation

## 2016-09-07 DIAGNOSIS — M545 Low back pain: Secondary | ICD-10-CM | POA: Insufficient documentation

## 2016-09-07 DIAGNOSIS — Z7982 Long term (current) use of aspirin: Secondary | ICD-10-CM | POA: Diagnosis not present

## 2016-09-07 DIAGNOSIS — Z79899 Other long term (current) drug therapy: Secondary | ICD-10-CM | POA: Diagnosis not present

## 2016-09-07 MED ORDER — IBUPROFEN 800 MG PO TABS: 800.0000 mg | ORAL_TABLET | Freq: Three times a day (TID) | ORAL | 0 refills | Status: DC

## 2016-09-07 MED ORDER — CYCLOBENZAPRINE HCL 10 MG PO TABS: 10.0000 mg | ORAL_TABLET | Freq: Two times a day (BID) | ORAL | 0 refills | Status: DC | PRN

## 2016-09-07 MED ORDER — IBUPROFEN 800 MG PO TABS
800.0000 mg | ORAL_TABLET | Freq: Once | ORAL | Status: AC
  Administered 2016-09-07: 800 mg via ORAL
  Filled 2016-09-07: qty 1

## 2016-09-07 NOTE — ED Notes (Signed)
PT DISCHARGED. INSTRUCTIONS AND PRESCRIPTIONS GIVEN. AAOX4. PT IN NO APPARENT DISTRESS. THE OPPORTUNITY TO ASK QUESTIONS WAS PROVIDED. 
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
## 2016-09-07 NOTE — ED Notes (Signed)
Patient transported to X-ray 

## 2016-09-07 NOTE — ED Notes (Signed)
Last call for pt with no answer

## 2016-09-07 NOTE — ED Notes (Signed)
Called 3 times to get vital signs on pt with no answer
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
## 2016-11-16 ENCOUNTER — Ambulatory Visit (HOSPITAL_COMMUNITY)
Admission: EM | Admit: 2016-11-16 | Discharge: 2016-11-16 | Disposition: A | Discharge status: Home / Self Care | Payer: Medicaid Other | Attending: Internal Medicine | Admitting: Internal Medicine

## 2016-11-16 ENCOUNTER — Encounter (HOSPITAL_COMMUNITY): Payer: Self-pay | Admitting: Emergency Medicine

## 2016-11-16 DIAGNOSIS — N390 Urinary tract infection, site not specified: Secondary | ICD-10-CM

## 2016-11-16 DIAGNOSIS — R350 Frequency of micturition: Secondary | ICD-10-CM | POA: Diagnosis not present

## 2016-11-16 DIAGNOSIS — R1084 Generalized abdominal pain: Secondary | ICD-10-CM | POA: Diagnosis not present

## 2016-11-16 LAB — POCT URINALYSIS DIP (DEVICE)
Bilirubin Urine: NEGATIVE
GLUCOSE, UA: NEGATIVE mg/dL
Ketones, ur: NEGATIVE mg/dL
Nitrite: POSITIVE — AB
Protein, ur: 100 mg/dL — AB
SPECIFIC GRAVITY, URINE: 1.025 (ref 1.005–1.030)
UROBILINOGEN UA: 0.2 mg/dL (ref 0.0–1.0)
pH: 6.5 (ref 5.0–8.0)

## 2016-11-16 MED ORDER — SULFAMETHOXAZOLE-TRIMETHOPRIM 800-160 MG PO TABS: 1.0000 | ORAL_TABLET | Freq: Two times a day (BID) | ORAL | 0 refills | Status: DC

## 2016-11-16 MED ORDER — VALACYCLOVIR HCL 1 G PO TABS: 1000.0000 mg | ORAL_TABLET | Freq: Three times a day (TID) | ORAL | 0 refills | Status: DC

## 2016-11-16 NOTE — ED Triage Notes (Signed)
[facility] urinary frequency and to get a work note return to work.

## 2016-11-16 NOTE — ED Provider Notes (Signed)
CSN: [id number]     Arrival date & time 11/16/16  1600 History   First MD Initiated Contact with Patient 11/16/16 1814     Chief Complaint  Patient presents with  . Urinary Urgency  . Generalized Body Aches   (Consider location/radiation/quality/duration/timing/severity/associated sxs/prior Treatment) C/o lower abd pain , frequency, for 2 weeks. States that she was treated for yeast infection but is beginning to have the urgency to void. Has a hx of uti and states these are the same symptoms. Denies any cv tenderness no n/v/d. 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 Does not have a pcp to refill this at this time. Has not taken anything for this.       Past Medical History:  Diagnosis Date  . Fibromyalgia    Past Surgical History:  Procedure Laterality Date  . KNEE SURGERY    . KNEE SURGERY Right    Family History  Problem Relation Age of Onset  . Heart failure Mother   . Diabetes Mother   . Hypertension Mother   . Cancer Other    Social History  Substance Use Topics  . Smoking status: Current Some Day Smoker    Types: Cigarettes  . Smokeless tobacco: Never Used  . Alcohol use No   OB History    No data available     Review of Systems  Constitutional: Negative.   Respiratory: Negative.   Cardiovascular: Negative.   Gastrointestinal: Positive for abdominal pain.  Genitourinary: Positive for difficulty urinating, frequency, pelvic pain and urgency.  Skin: Negative.   Neurological: Negative.     Allergies  Shrimp [shellfish allergy]; Dust mite extract; and Other  Home Medications   Prior to Admission medications   Medication Sig Start Date End Date Taking? Authorizing Provider  HYDROcodone-acetaminophen (NORCO/VICODIN) 5-325 MG per tablet Take 1-2 tablets every 6 hours as needed for severe pain 11/23/14  Yes Renne CriglerJoshua Geiple, PA-C  pregabalin (LYRICA) 100 MG capsule Take 100 mg by mouth 3  (three) times daily.    Historical Provider, MD  sulfamethoxazole-trimethoprim (BACTRIM DS,SEPTRA DS) 800-160 MG tablet Take 1 tablet by mouth 2 (two) times daily. 11/16/16   Tobi BastosMelanie A Jsoeph Podesta, NP  valACYclovir (VALTREX) 1000 MG tablet Take 1 tablet (1,000 mg total) by mouth 3 (three) times daily. 11/16/16   Tobi BastosMelanie A Latrelle Fuston, NP   Meds Ordered and Administered this Visit  Medications - No data to display  BP 111/65 (BP Location: Right Arm)   Pulse 113   Temp 98.8 F (37.1 C) (Oral)   Resp 16   SpO2 100%  No data found.   Physical Exam  Constitutional: She appears well-developed.  Cardiovascular: Normal rate and regular rhythm.   Pulmonary/Chest: Effort normal and breath sounds normal.  Abdominal: Soft. Bowel sounds are normal. There is tenderness.  No cv tenderness,   Musculoskeletal: Normal range of motion.  Neurological: She is alert.  Skin: Skin is warm.    Urgent Care Course     Procedures (including critical care time)  Labs Review Labs Reviewed  POCT URINALYSIS DIP (DEVICE) - Abnormal; Notable for the following:       Result Value   Hgb urine dipstick MODERATE (*)    Protein, ur 100 (*)    Nitrite POSITIVE (*)    Leukocytes, UA SMALL (*)    All other components within normal limits    Imaging Review No  results found.              MDM   1. Lower urinary tract infectious disease   2. Generalized abdominal pain   3. Frequency of urination    Take full dose of abx You will need to to see pcp for any refill on valtrex will only give one time  Drink plenty of fluids Reviewed previous visits.      Tobi Bastos, NP 11/16/16 [phone]

## 2017-01-19 ENCOUNTER — Encounter (HOSPITAL_COMMUNITY): Payer: Self-pay | Admitting: Emergency Medicine

## 2017-01-19 ENCOUNTER — Ambulatory Visit (HOSPITAL_COMMUNITY)
Admission: EM | Admit: 2017-01-19 | Discharge: 2017-01-19 | Disposition: A | Discharge status: Home / Self Care | Payer: Self-pay | Attending: Family Medicine | Admitting: Family Medicine

## 2017-01-19 DIAGNOSIS — F419 Anxiety disorder, unspecified: Secondary | ICD-10-CM

## 2017-01-19 DIAGNOSIS — R110 Nausea: Secondary | ICD-10-CM

## 2017-01-19 MED ORDER — HYDROXYZINE HCL 25 MG PO TABS: 25.0000 mg | ORAL_TABLET | Freq: Four times a day (QID) | ORAL | 0 refills | Status: DC

## 2017-01-19 MED ORDER — ONDANSETRON 4 MG PO TBDP: 4.0000 mg | ORAL_TABLET | Freq: Three times a day (TID) | ORAL | 0 refills | Status: DC | PRN

## 2017-01-19 NOTE — ED Provider Notes (Signed)
CSN: [id number]     Arrival date & time 01/19/17  1733 History   First MD Initiated Contact with Patient 01/19/17 1744     Chief Complaint  Patient presents with  . Anxiety  . Abdominal Pain   (Consider location/radiation/quality/duration/timing/severity/associated sxs/prior Treatment) Patient is c/o anxiety and GI sx's.  She has hx of FMS and IBS.  She has been experiencing some anxiety attacks. She is nauseated.  She is having difficulty working.   The history is provided by the patient.  Anxiety  This is a new problem. The current episode started 2 days ago. The problem occurs constantly. The problem has not changed since onset.Associated symptoms include abdominal pain. Nothing aggravates the symptoms. Nothing relieves the symptoms. She has tried nothing for the symptoms.  Abdominal Pain    Past Medical History:  Diagnosis Date  . Fibromyalgia    Past Surgical History:  Procedure Laterality Date  . KNEE SURGERY    . KNEE SURGERY Right    Family History  Problem Relation Age of Onset  . Heart failure Mother   . Diabetes Mother   . Hypertension Mother   . Cancer Other    Social History  Substance Use Topics  . Smoking status: Current Some Day Smoker    Types: Cigarettes  . Smokeless tobacco: Never Used  . Alcohol use No   OB History    No data available     Review of Systems  Constitutional: Negative.   HENT: Negative.   Eyes: Negative.   Respiratory: Negative.   Cardiovascular: Negative.   Gastrointestinal: Positive for abdominal pain.  Endocrine: Negative.   Genitourinary: Negative.   Musculoskeletal: Negative.   Allergic/Immunologic: Negative.   Neurological: Negative.   Hematological: Negative.   Psychiatric/Behavioral: Negative.     Allergies  Shrimp [shellfish allergy]; Dust mite extract; and Other  Home Medications   Prior to Admission medications   Medication Sig Start Date End Date Taking? Authorizing Provider  hydrOXYzine  (ATARAX/VISTARIL) 25 MG tablet Take 1 tablet (25 mg total) by mouth every 6 (six) hours. 01/19/17   Deatra Canter, FNP  ondansetron (ZOFRAN ODT) 4 MG disintegrating tablet Take 1 tablet (4 mg total) by mouth every 8 (eight) hours as needed for nausea or vomiting. 01/19/17   Deatra Canter, FNP  pregabalin (LYRICA) 100 MG capsule Take 100 mg by mouth 3 (three) times daily.    Historical Provider, MD   Meds Ordered and Administered this Visit  Medications - No data to display  BP (!) 149/68 (BP Location: Right Arm)   Pulse (!) 119   Temp 98.5 F (36.9 C) (Oral)   Resp 20   SpO2 99%  No data found.   Physical Exam  Constitutional: She appears well-developed and well-nourished.  HENT:  Head: Normocephalic and atraumatic.  Right Ear: External ear normal.  Left Ear: External ear normal.  Mouth/Throat: Oropharynx is clear and moist.  Eyes: Conjunctivae and EOM are normal. Pupils are equal, round, and reactive to light.  Neck: Normal range of motion. Neck supple.  Cardiovascular: Normal rate, regular rhythm and normal heart sounds.   Pulmonary/Chest: Effort normal and breath sounds normal.  Abdominal: Soft. Bowel sounds are normal.  Psychiatric:  anxious  Nursing note and vitals reviewed.   Urgent Care Course     Procedures (including critical care time)  Labs Review Labs Reviewed - No data to display  Imaging Review No results found.   Visual Acuity Review  Right Eye Distance:  Left Eye Distance:   Bilateral Distance:    Right Eye Near:   Left Eye Near:    Bilateral Near:         MDM   1. Anxiety   2. Nausea    Hydroxyzine  one q 6 hours prn #30 Zofran ODT  one po tid prn #21  Explained she needs to establish with PCP and get tx'd for her anxiety with maintenance meds and she needs labs.  Referral to [facility] and call tomorrow for an appointment  Work note for next 2 days.    Deatra Canter, FNP 01/19/17 [phone]

## 2017-01-19 NOTE — ED Triage Notes (Signed)
The patient presented to the [facility] with a complaint of anxiety and abdominal pain with N/V for "a while."

## 2017-03-29 ENCOUNTER — Encounter (HOSPITAL_COMMUNITY): Payer: Self-pay | Admitting: *Deleted

## 2017-03-29 ENCOUNTER — Ambulatory Visit (HOSPITAL_COMMUNITY)
Admission: EM | Admit: 2017-03-29 | Discharge: 2017-03-29 | Disposition: A | Discharge status: Home / Self Care | Payer: Self-pay | Attending: Family Medicine | Admitting: Family Medicine

## 2017-03-29 DIAGNOSIS — F410 Panic disorder [episodic paroxysmal anxiety] without agoraphobia: Secondary | ICD-10-CM | POA: Insufficient documentation

## 2017-03-29 DIAGNOSIS — N898 Other specified noninflammatory disorders of vagina: Secondary | ICD-10-CM | POA: Insufficient documentation

## 2017-03-29 DIAGNOSIS — F17210 Nicotine dependence, cigarettes, uncomplicated: Secondary | ICD-10-CM | POA: Insufficient documentation

## 2017-03-29 DIAGNOSIS — Z79899 Other long term (current) drug therapy: Secondary | ICD-10-CM | POA: Insufficient documentation

## 2017-03-29 MED ORDER — METRONIDAZOLE 500 MG PO TABS: 500.0000 mg | ORAL_TABLET | Freq: Two times a day (BID) | ORAL | 0 refills | Status: DC
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
## 2017-03-29 NOTE — ED Triage Notes (Signed)
Pt  Reports   Symptoms  Of  Vaginal  Discharge    And  Foul       Smelling   Discharge     X   sev   Weeks   Pt also  Reports   To  Talk  To  The  Provider  About  Anxiety

## 2017-03-30 LAB — URINE CYTOLOGY ANCILLARY ONLY
Chlamydia: NEGATIVE
Neisseria Gonorrhea: NEGATIVE
TRICH (WINDOWPATH): NEGATIVE

## 2017-04-01 LAB — URINE CYTOLOGY ANCILLARY ONLY
BACTERIAL VAGINITIS: POSITIVE — AB
CANDIDA VAGINITIS: NEGATIVE

## 2017-08-18 ENCOUNTER — Encounter (HOSPITAL_COMMUNITY): Payer: Self-pay | Admitting: Emergency Medicine

## 2017-08-18 ENCOUNTER — Ambulatory Visit (HOSPITAL_COMMUNITY)
Admission: EM | Admit: 2017-08-18 | Discharge: 2017-08-18 | Disposition: A | Discharge status: Home / Self Care | Payer: Self-pay | Attending: Family Medicine | Admitting: Family Medicine

## 2017-08-18 ENCOUNTER — Other Ambulatory Visit: Payer: Self-pay

## 2017-08-18 DIAGNOSIS — F411 Generalized anxiety disorder: Secondary | ICD-10-CM

## 2017-08-18 DIAGNOSIS — G8929 Other chronic pain: Secondary | ICD-10-CM

## 2017-08-18 DIAGNOSIS — M545 Low back pain, unspecified: Secondary | ICD-10-CM

## 2017-08-18 DIAGNOSIS — A6004 Herpesviral vulvovaginitis: Secondary | ICD-10-CM

## 2017-08-18 DIAGNOSIS — B9689 Other specified bacterial agents as the cause of diseases classified elsewhere: Secondary | ICD-10-CM

## 2017-08-18 DIAGNOSIS — N760 Acute vaginitis: Secondary | ICD-10-CM

## 2017-08-18 HISTORY — DX: Herpesviral infection of urogenital system, unspecified: A60.00

## 2017-08-18 HISTORY — DX: Scoliosis, unspecified: M41.9

## 2017-08-18 HISTORY — DX: Acute vaginitis: N76.0

## 2017-08-18 HISTORY — DX: Adult sexual abuse, confirmed, initial encounter: T74.21XA

## 2017-08-18 HISTORY — DX: Other specified bacterial agents as the cause of diseases classified elsewhere: B96.89

## 2017-08-18 MED ORDER — METRONIDAZOLE 500 MG PO TABS: 500.0000 mg | ORAL_TABLET | Freq: Two times a day (BID) | ORAL | 0 refills | Status: DC

## 2017-08-18 MED ORDER — PROPRANOLOL HCL 10 MG PO TABS: 10.0000 mg | ORAL_TABLET | Freq: Three times a day (TID) | ORAL | 0 refills | Status: DC

## 2017-08-18 MED ORDER — NAPROXEN 375 MG PO TABS: 375.0000 mg | ORAL_TABLET | Freq: Two times a day (BID) | ORAL | 0 refills | Status: DC

## 2017-08-18 MED ORDER — ACYCLOVIR 800 MG PO TABS: 800.0000 mg | ORAL_TABLET | Freq: Three times a day (TID) | ORAL | 1 refills | Status: DC
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
## 2017-08-18 NOTE — Discharge Instructions (Signed)
Take the medication as directed. He will also have been prescribed propranolol which is a beta blocker 10 mg 3 times a day. This is a low starting dose that can help with nervousness and rapid heart rate. If it makes you feel fatigued or other side effects to stop the medication. May return if worse.

## 2018-01-25 ENCOUNTER — Ambulatory Visit (HOSPITAL_COMMUNITY)
Admission: EM | Admit: 2018-01-25 | Discharge: 2018-01-25 | Disposition: A | Discharge status: Home / Self Care | Payer: Self-pay | Attending: Family Medicine | Admitting: Family Medicine

## 2018-01-25 ENCOUNTER — Ambulatory Visit (INDEPENDENT_AMBULATORY_CARE_PROVIDER_SITE_OTHER): Payer: Self-pay

## 2018-01-25 ENCOUNTER — Encounter (HOSPITAL_COMMUNITY): Payer: Self-pay | Admitting: Family Medicine

## 2018-01-25 DIAGNOSIS — M797 Fibromyalgia: Secondary | ICD-10-CM | POA: Insufficient documentation

## 2018-01-25 DIAGNOSIS — Z8619 Personal history of other infectious and parasitic diseases: Secondary | ICD-10-CM

## 2018-01-25 DIAGNOSIS — Z91013 Allergy to seafood: Secondary | ICD-10-CM | POA: Insufficient documentation

## 2018-01-25 DIAGNOSIS — M419 Scoliosis, unspecified: Secondary | ICD-10-CM | POA: Insufficient documentation

## 2018-01-25 DIAGNOSIS — A6000 Herpesviral infection of urogenital system, unspecified: Secondary | ICD-10-CM

## 2018-01-25 DIAGNOSIS — Z833 Family history of diabetes mellitus: Secondary | ICD-10-CM | POA: Insufficient documentation

## 2018-01-25 DIAGNOSIS — R21 Rash and other nonspecific skin eruption: Secondary | ICD-10-CM | POA: Insufficient documentation

## 2018-01-25 DIAGNOSIS — Z87891 Personal history of nicotine dependence: Secondary | ICD-10-CM | POA: Insufficient documentation

## 2018-01-25 DIAGNOSIS — Z760 Encounter for issue of repeat prescription: Secondary | ICD-10-CM

## 2018-01-25 DIAGNOSIS — N898 Other specified noninflammatory disorders of vagina: Secondary | ICD-10-CM | POA: Insufficient documentation

## 2018-01-25 DIAGNOSIS — Z8249 Family history of ischemic heart disease and other diseases of the circulatory system: Secondary | ICD-10-CM | POA: Insufficient documentation

## 2018-01-25 MED ORDER — FLUCONAZOLE 150 MG PO TABS: 150.0000 mg | ORAL_TABLET | Freq: Every day | ORAL | 0 refills | Status: DC

## 2018-01-25 MED ORDER — VALACYCLOVIR HCL 1 G PO TABS: 1000.0000 mg | ORAL_TABLET | Freq: Every day | ORAL | 1 refills | Status: AC

## 2018-01-25 MED ORDER — CYCLOBENZAPRINE HCL 10 MG PO TABS: 10.0000 mg | ORAL_TABLET | Freq: Three times a day (TID) | ORAL | 0 refills | Status: DC | PRN

## 2018-01-25 MED ORDER — TRIAMCINOLONE ACETONIDE 0.1 % EX CREA: 1.0000 "application " | TOPICAL_CREAM | Freq: Two times a day (BID) | CUTANEOUS | 0 refills | Status: AC

## 2018-01-25 MED ORDER — IBUPROFEN 800 MG PO TABS: 800.0000 mg | ORAL_TABLET | Freq: Three times a day (TID) | ORAL | 0 refills | Status: DC

## 2018-01-25 MED ORDER — METRONIDAZOLE 500 MG PO TABS: 500.0000 mg | ORAL_TABLET | Freq: Two times a day (BID) | ORAL | 0 refills | Status: DC
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
## 2018-01-25 NOTE — ED Triage Notes (Signed)
Pt here for rash to face and arms since she got a new dog.  She also is concerned she has BV.  Also mid and lower back pain.  She needs refill on herpes medication.

## 2018-01-26 LAB — CERVICOVAGINAL ANCILLARY ONLY
Bacterial vaginitis: POSITIVE — AB
Candida vaginitis: POSITIVE — AB

## 2018-01-27 ENCOUNTER — Telehealth (HOSPITAL_COMMUNITY): Payer: Self-pay

## 2018-01-27 ENCOUNTER — Emergency Department (HOSPITAL_COMMUNITY)
Admission: EM | Admit: 2018-01-27 | Discharge: 2018-01-27 | Disposition: A | Discharge status: Home / Self Care | Payer: Self-pay | Attending: Emergency Medicine | Admitting: Emergency Medicine

## 2018-01-27 ENCOUNTER — Encounter (HOSPITAL_COMMUNITY): Payer: Self-pay | Admitting: Emergency Medicine

## 2018-01-27 DIAGNOSIS — B341 Enterovirus infection, unspecified: Secondary | ICD-10-CM | POA: Insufficient documentation

## 2018-01-27 HISTORY — DX: Polyneuropathy, unspecified: G62.9

## 2018-01-27 MED ORDER — HYDROXYZINE HCL 25 MG PO TABS: 25.0000 mg | ORAL_TABLET | Freq: Four times a day (QID) | ORAL | 0 refills | Status: DC

## 2018-01-27 MED ORDER — FLUCONAZOLE 150 MG PO TABS: 150.0000 mg | ORAL_TABLET | Freq: Every day | ORAL | 0 refills | Status: AC

## 2018-01-27 MED ORDER — METRONIDAZOLE 500 MG PO TABS: 500.0000 mg | ORAL_TABLET | Freq: Two times a day (BID) | ORAL | 0 refills | Status: DC

## 2018-01-27 NOTE — ED Notes (Signed)
Pt states she has been using otc calamine lotion and anti itch cream. Also states she has a rx for triamcinolone acetonide.

## 2018-01-27 NOTE — ED Triage Notes (Signed)
Patient c/o rash to face, chest and hands x 2 days. [facility] to [facility]. States she took Claritin this am. Got a new dog on Saturday.   Pt adds she has back pain that was given flexeril and had xray done.

## 2018-01-27 NOTE — Discharge Instructions (Signed)
Continue taking your medications as directed in addition to the medication we give you. The medication we give you for itching can make you sleepy.  [facility] and make an appointment for follow up.

## 2018-01-27 NOTE — Telephone Encounter (Signed)
Bacterial vaginosis is positive. This was not treated at the urgent care visit.  Patient complains of persistent symptoms.  Flagyl 500 mg BID x 7 days #14 no refills sent to patients pharmacy of choice per Dr. Murray.  Pt called and made aware of results and new prescription. Answered all questions and pt verbalized understanding.   Pt contacted regarding test for candida (yeast) was positive.  Prescription for fluconazole 150mg po now, repeat dose in 3d if needed, #2 no refills, sent to the pharmacy of record.  Recheck or followup with PCP for further evaluation if symptoms are not improving.  Answered all questions. 
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
## 2018-01-28 LAB — RPR: RPR: NONREACTIVE

## 2018-01-29 ENCOUNTER — Ambulatory Visit (HOSPITAL_COMMUNITY)
Admission: EM | Admit: 2018-01-29 | Discharge: 2018-01-29 | Disposition: A | Discharge status: Home / Self Care | Payer: Self-pay | Attending: Family Medicine | Admitting: Family Medicine

## 2018-01-29 ENCOUNTER — Encounter (HOSPITAL_COMMUNITY): Payer: Self-pay | Admitting: *Deleted

## 2018-01-29 ENCOUNTER — Other Ambulatory Visit: Payer: Self-pay

## 2018-01-29 DIAGNOSIS — R21 Rash and other nonspecific skin eruption: Secondary | ICD-10-CM

## 2018-01-29 LAB — POCT PREGNANCY, URINE: Preg Test, Ur: NEGATIVE

## 2018-01-29 MED ORDER — DOXYCYCLINE HYCLATE 100 MG PO CAPS: 100.0000 mg | ORAL_CAPSULE | Freq: Two times a day (BID) | ORAL | 0 refills | Status: AC

## 2018-01-29 MED ORDER — TRAMADOL HCL 50 MG PO TABS: 50.0000 mg | ORAL_TABLET | Freq: Two times a day (BID) | ORAL | 0 refills | Status: DC | PRN
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
## 2018-01-29 NOTE — ED Triage Notes (Signed)
Was seen in ED 4/18 - dx coxsackie virus.  C/O bilat foot pain and pruritis and generalized body aches.

## 2018-01-29 NOTE — Discharge Instructions (Signed)
Use hydrocortisone 1% twice daily on face. The other cream is a bit stronger than I would recommend.   Continue ibuprofen.  OK to take Tylenol 1000 mg (2 extra strength tabs) or 975 mg (3 regular strength tabs) every 6 hours as needed.  Continue to push fluids, practice good hand hygiene, and cover your mouth if you cough.  If you start having fevers, shaking or shortness of breath, seek immediate care.

## 2018-01-31 ENCOUNTER — Encounter (HOSPITAL_COMMUNITY): Payer: Self-pay | Admitting: Family Medicine

## 2018-01-31 ENCOUNTER — Ambulatory Visit (INDEPENDENT_AMBULATORY_CARE_PROVIDER_SITE_OTHER): Payer: Self-pay

## 2018-01-31 ENCOUNTER — Ambulatory Visit (HOSPITAL_COMMUNITY)
Admission: EM | Admit: 2018-01-31 | Discharge: 2018-01-31 | Disposition: A | Discharge status: Home / Self Care | Payer: Self-pay | Attending: Family Medicine | Admitting: Family Medicine

## 2018-01-31 DIAGNOSIS — M797 Fibromyalgia: Secondary | ICD-10-CM

## 2018-01-31 MED ORDER — GABAPENTIN 300 MG PO CAPS: 300.0000 mg | ORAL_CAPSULE | Freq: Two times a day (BID) | ORAL | 2 refills | Status: DC

## 2018-01-31 MED ORDER — HYDROCODONE-ACETAMINOPHEN 5-325 MG PO TABS: 2.0000 | ORAL_TABLET | ORAL | 0 refills | Status: DC | PRN
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
## 2018-01-31 NOTE — ED Triage Notes (Addendum)
Pt with a hx of fibromyalgia her for rash all over and body pain. She as been seen multiple times for the same problem. She was told that she had hand, foot, mouth disease.

## 2018-02-08 ENCOUNTER — Ambulatory Visit: Payer: Self-pay | Attending: Internal Medicine | Admitting: Internal Medicine

## 2018-02-08 ENCOUNTER — Encounter: Payer: Self-pay | Admitting: Internal Medicine

## 2018-02-08 VITALS — BP 119/78 | HR 91 | Temp 98.7°F | Resp 16 | Ht 68.0 in | Wt 220.6 lb

## 2018-02-08 DIAGNOSIS — N898 Other specified noninflammatory disorders of vagina: Secondary | ICD-10-CM | POA: Insufficient documentation

## 2018-02-08 DIAGNOSIS — F419 Anxiety disorder, unspecified: Secondary | ICD-10-CM

## 2018-02-08 DIAGNOSIS — M545 Low back pain, unspecified: Secondary | ICD-10-CM

## 2018-02-08 DIAGNOSIS — Z1331 Encounter for screening for depression: Secondary | ICD-10-CM

## 2018-02-08 DIAGNOSIS — M797 Fibromyalgia: Secondary | ICD-10-CM

## 2018-02-08 DIAGNOSIS — M79672 Pain in left foot: Secondary | ICD-10-CM | POA: Insufficient documentation

## 2018-02-08 DIAGNOSIS — G8929 Other chronic pain: Secondary | ICD-10-CM | POA: Insufficient documentation

## 2018-02-08 DIAGNOSIS — Z87891 Personal history of nicotine dependence: Secondary | ICD-10-CM | POA: Insufficient documentation

## 2018-02-08 DIAGNOSIS — R21 Rash and other nonspecific skin eruption: Secondary | ICD-10-CM | POA: Insufficient documentation

## 2018-02-08 DIAGNOSIS — M79671 Pain in right foot: Secondary | ICD-10-CM | POA: Insufficient documentation

## 2018-02-08 MED ORDER — DULOXETINE HCL 20 MG PO CPEP: 20.0000 mg | ORAL_CAPSULE | Freq: Every day | ORAL | 1 refills | Status: DC

## 2018-02-08 MED ORDER — NAPROXEN 500 MG PO TABS: 500.0000 mg | ORAL_TABLET | Freq: Two times a day (BID) | ORAL | 1 refills | Status: DC | PRN

## 2018-02-08 MED ORDER — CYCLOBENZAPRINE HCL 5 MG PO TABS: 5.0000 mg | ORAL_TABLET | Freq: Two times a day (BID) | ORAL | 1 refills | Status: DC | PRN

## 2018-02-08 MED ORDER — GABAPENTIN 300 MG PO CAPS
300.0000 mg | ORAL_CAPSULE | Freq: Two times a day (BID) | ORAL | 2 refills | Status: DC
Start: 2018-02-08 — End: 2018-12-09

## 2018-02-08 MED FILL — CYCLOBENZAPRINE 5 MG TABLET: 5 | 15 days supply | Qty: 30 | Fill #0

## 2018-02-08 MED FILL — NAPROXEN 500 MG TABLET: 500 | 15 days supply | Qty: 30 | Fill #0

## 2018-02-08 MED FILL — DULoxetine HCL 20 MG CPEP: 20 | 30 days supply | Qty: 30 | Fill #0

## 2018-02-08 MED FILL — GABAPENTIN 300 MG CAPSULE: 300 | 30 days supply | Qty: 60 | Fill #0

## 2018-02-08 NOTE — Progress Notes (Signed)
Patient ID: Stephanie Bean, female    DOB: 1996/04/25  MRN: [id number]  CC: Hospitalization Follow-up (ED)   Subjective: Stephanie Bean is a 22 y.o. female who presents for new pt visit and f/u ED visits Her concerns today include:   Previous PCP was at [facility] in [place].  Loss Medicaid. Currently in school at [employer]  Hx of fibromyalgia, chronic back pain, RT torn ACL (surgery 2016), anxiety, genital herpes  Patient seen in ED 4 times over the past 1 month: 01/25/2018- complained of vaginal discharge and nonradicular lower back pain x1 month.  She also complained of new rash.  X-ray revealed mild scoliosis of the lumbar spine.  Patient advised to take ibuprofen 800 mg as needed and was given prescription for Flexeril. 01/27/2018- patient seen for rash.  Prescribed hydroxyzine 01/29/2018- seen for bilateral foot pain, body aches and continued body rash.  Prescribed tramadol.  Given doxycycline to cover prophylactically for RMSF  01/31/2018 -seen for generalized myalgias and foot pain.  Patient informed ER physician that she has history of fibromyalgia and was on Lyrica but could no longer afford after she lost Medicaid.  Patient started on gabapentin and told to follow-up with PCP  1.  C/o pain in lower back x 3 wks.  Mainly across lower back but more on LT side.  No initiating factors.  She reports aching sometimes in the lower back during cloudy weather and initially thought this was the case given the amount of rain that we had over the past several weeks.  However the pain has progressed.  There is no radiation of pain down the legs.  No numbness or tingling in the legs.  Some tingling in the bottom of both feet at times.  No weakness in the legs.  No loss of bowel or bladder function.  The pain makes it uncomfortable when she is sitting and lying down.  Better with standing. She has been taking ibuprofen and Flexeril.  With minimal improvement.  Recently given a limited amount of  hydrocodone.  Reports history of chronic pain in her body right knee, and back.  Was seeing a pain specialist in [place]. -Reports increased body aches when she is stressed.  She was on Lyrica 150 mg BID for fibromyalgia.  She reports that this helped some.  Now on gabapentin which she has been taking as needed.  Also gives history of anxiety which increases with stress.  She has not been on medication for this.  She states that she tries to manage it in natural ways.    Current Outpatient Medications on File Prior to Visit  Medication Sig Dispense Refill  . HYDROcodone-acetaminophen (NORCO/VICODIN) 5-325 MG tablet Take 2 tablets by mouth every 4 (four) hours as needed. 10 tablet 0  . valACYclovir (VALTREX) 1000 MG tablet Take 1 tablet (1,000 mg total) by mouth daily. 90 tablet 1   No current facility-administered medications on file prior to visit.     Allergies  Allergen Reactions  . Shrimp [Shellfish Allergy] Anaphylaxis    Throat itching  . Dust Mite Extract Other (See Comments)    Allergy symptoms  . Other Other (See Comments)    Grass--allergy symptoms    Social History   Socioeconomic History  . Marital status: Single    Spouse name: Not on file  . Number of children: Not on file  . Years of education: Not on file  . Highest education level: Not on file  Occupational History  .  Not on file  Social Needs  . Financial resource strain: Not on file  . Food insecurity:    Worry: Not on file    Inability: Not on file  . Transportation needs:    Medical: Not on file    Non-medical: Not on file  Tobacco Use  . Smoking status: Former Smoker    Types: Cigarettes  . Smokeless tobacco: Never Used  Substance and Sexual Activity  . Alcohol use: Yes    Comment: occasional  . Drug use: Yes    Frequency: 3.0 times per week    Types: Marijuana  . Sexual activity: Yes  Lifestyle  . Physical activity:    Days per week: Not on file    Minutes per session: Not on file  .  Stress: Not on file  Relationships  . Social connections:    Talks on phone: Not on file    Gets together: Not on file    Attends religious service: Not on file    Active member of club or organization: Not on file    Attends meetings of clubs or organizations: Not on file    Relationship status: Not on file  . Intimate partner violence:    Fear of current or ex partner: Not on file    Emotionally abused: Not on file    Physically abused: Not on file    Forced sexual activity: Not on file  Other Topics Concern  . Not on file  Social History Narrative  . Not on file    Family History  Problem Relation Age of Onset  . Heart failure Mother   . Diabetes Mother   . Hypertension Mother   . Cancer Other     Past Surgical History:  Procedure Laterality Date  . KNEE SURGERY    . KNEE SURGERY Right   . KNEE SURGERY Right 2016    ROS: Review of Systems  Negative except as stated above  PHYSICAL EXAM: BP 119/78   Pulse 91   Temp 98.7 F (37.1 C) (Oral)   Resp 16   Ht  (1.727 m)   Wt 220 lb 9.6 oz (100.1 kg)   LMP 01/30/2018   SpO2 97%   BMI 33.54 kg/m   Physical Exam  General appearance - alert, well appearing, and in no distress Mental status - normal mood, behavior, speech, dress, motor activity, and thought processes Neurological - power LEs 5/5 BL prox and distal. Straight leg raise on LT produces some discomfort in lower back Musculoskeletal - unable to appreciated lumbar curvature on flexion of spine (pt asked to bend over as if to  Touch toes).  Mild tenderness on palpation of lower thoracic and upper lumbar spine And paraspinal muscles more LT lumbar  Depression screen PHQ 2/9 02/08/2018  Decreased Interest 1  Down, Depressed, Hopeless 2  PHQ - 2 Score 3  Altered sleeping 2  Tired, decreased energy 1  Change in appetite 2  Feeling bad or failure about yourself  1  Trouble concentrating 3  Moving slowly or fidgety/restless 0  Suicidal thoughts 0    PHQ-9 Score 12    ASSESSMENT AND PLAN: 1. Acute bilateral low back pain without sciatica Seems acute on chronic and non-radicular Rec applying for [facility] discount so that we can refer for some P.T.  Can also do advance imaging in future if not getting better  RF Flexeril.  Change Ibuprofen to Naprosyn.  Add Cymbalta which can help with MSK pain,  fibromyalgia and anxiety.  Pt agreeable to this -Heating pad use PRN -pt seem to want more for pain other than what was recommended.  Pt that we do not prescribe Hydrocodone here and I do not think it is clinically indicated.  Advise to let me know once she is approved for [facility] discount so that we can refer for P.T - cyclobenzaprine (FLEXERIL) 5 MG tablet; Take 1 tablet (5 mg total) by mouth 2 (two) times daily as needed for muscle spasms.  Dispense: 30 tablet; Refill: 1 - DULoxetine (CYMBALTA) 20 MG capsule; Take 1 capsule (20 mg total) by mouth daily.  Dispense: 30 capsule; Refill: 1 - naproxen (NAPROSYN) 500 MG tablet; Take 1 tablet (500 mg total) by mouth 2 (two) times daily as needed.  Dispense: 30 tablet; Refill: 1  2. Fibromyalgia -advised to take the Gabapentin as prescribed rather than PRN - gabapentin (NEURONTIN) 300 MG capsule; Take 1 capsule (300 mg total) by mouth 2 (two) times daily.  Dispense: 60 capsule; Refill: 2  3. Positive depression screening Positive but pt did not feel that this was a major issue for her at this time  4. Anxiety disorder, unspecified type - DULoxetine (CYMBALTA) 20 MG capsule; Take 1 capsule (20 mg total) by mouth daily.  Dispense: 30 capsule; Refill: 1  Patient was given the opportunity to ask questions.  Patient verbalized understanding of the plan and was able to repeat key elements of the plan.   No orders of the defined types were placed in this encounter.    Requested Prescriptions   Signed Prescriptions Disp Refills  . gabapentin (NEURONTIN) 300 MG capsule 60 capsule 2    Sig: Take 1  capsule (300 mg total) by mouth 2 (two) times daily.  . cyclobenzaprine (FLEXERIL) 5 MG tablet 30 tablet 1    Sig: Take 1 tablet (5 mg total) by mouth 2 (two) times daily as needed for muscle spasms.  . DULoxetine (CYMBALTA) 20 MG capsule 30 capsule 1    Sig: Take 1 capsule (20 mg total) by mouth daily.  . naproxen (NAPROSYN) 500 MG tablet 30 tablet 1    Sig: Take 1 tablet (500 mg total) by mouth 2 (two) times daily as needed.    Return if symptoms worsen or fail to improve.  Jonah Blue, MD, FACP

## 2018-02-08 NOTE — Patient Instructions (Signed)
Use a heating pad to your lower back as needed. Avoid heavy lifting or excessive bending. Use the Flexeril and naprosyn as needed.  Start Cymbalta to help with anxiety and pain.

## 2018-02-09 DIAGNOSIS — Z1331 Encounter for screening for depression: Secondary | ICD-10-CM | POA: Insufficient documentation

## 2018-02-09 DIAGNOSIS — F419 Anxiety disorder, unspecified: Secondary | ICD-10-CM | POA: Insufficient documentation

## 2018-02-09 DIAGNOSIS — A6000 Herpesviral infection of urogenital system, unspecified: Secondary | ICD-10-CM | POA: Insufficient documentation

## 2018-02-09 DIAGNOSIS — M797 Fibromyalgia: Secondary | ICD-10-CM | POA: Insufficient documentation

## 2018-11-21 ENCOUNTER — Other Ambulatory Visit: Payer: Self-pay

## 2018-11-21 ENCOUNTER — Encounter (HOSPITAL_COMMUNITY): Payer: Self-pay

## 2018-11-21 ENCOUNTER — Ambulatory Visit (HOSPITAL_COMMUNITY)
Admission: EM | Admit: 2018-11-21 | Discharge: 2018-11-21 | Disposition: A | Discharge status: Home / Self Care | Payer: Self-pay | Attending: Physician Assistant | Admitting: Physician Assistant

## 2018-11-21 DIAGNOSIS — R112 Nausea with vomiting, unspecified: Secondary | ICD-10-CM

## 2018-11-21 DIAGNOSIS — R197 Diarrhea, unspecified: Secondary | ICD-10-CM

## 2018-11-21 MED ORDER — FAMOTIDINE 20 MG PO TABS: 20.0000 mg | ORAL_TABLET | Freq: Every day | ORAL | 0 refills | Status: DC

## 2018-11-21 MED ORDER — FAMOTIDINE 20 MG PO TABS: 20.0000 mg | ORAL_TABLET | Freq: Two times a day (BID) | ORAL | 0 refills | Status: DC

## 2018-11-21 MED ORDER — DICYCLOMINE HCL 20 MG PO TABS: 20.0000 mg | ORAL_TABLET | Freq: Two times a day (BID) | ORAL | 0 refills | Status: DC

## 2018-11-21 MED ORDER — ONDANSETRON 4 MG PO TBDP: 4.0000 mg | ORAL_TABLET | Freq: Three times a day (TID) | ORAL | 0 refills | Status: DC | PRN

## 2018-11-21 NOTE — Discharge Instructions (Addendum)
Zofran for nausea and vomiting as needed. Bentyl for abdominal cramping. Keep hydrated, you urine should be clear to pale yellow in color. Bland diet, advance as tolerated. Monitor for any worsening of symptoms, nausea or vomiting not controlled by medication, worsening abdominal pain, fever, go to the emergency department for further evaluation.  Once the symptoms improve, if you are still having morning vomiting, start pepcid daily for 2 weeks for possible acid reflux causing symptoms.

## 2018-11-21 NOTE — ED Triage Notes (Signed)
Pt cc diarrhea and vomiting x 2 days 
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
## 2018-12-09 ENCOUNTER — Encounter (HOSPITAL_COMMUNITY): Payer: Self-pay | Admitting: Emergency Medicine

## 2018-12-09 ENCOUNTER — Other Ambulatory Visit: Payer: Self-pay

## 2018-12-09 ENCOUNTER — Ambulatory Visit (HOSPITAL_COMMUNITY)
Admission: EM | Admit: 2018-12-09 | Discharge: 2018-12-09 | Disposition: A | Discharge status: Home / Self Care | Payer: Medicaid Other

## 2018-12-09 DIAGNOSIS — M797 Fibromyalgia: Secondary | ICD-10-CM

## 2018-12-09 DIAGNOSIS — M25561 Pain in right knee: Secondary | ICD-10-CM

## 2018-12-09 DIAGNOSIS — G8929 Other chronic pain: Secondary | ICD-10-CM

## 2018-12-09 MED ORDER — CYCLOBENZAPRINE HCL 5 MG PO TABS: 5.0000 mg | ORAL_TABLET | Freq: Every day | ORAL | 0 refills | Status: DC

## 2018-12-09 MED ORDER — GABAPENTIN 300 MG PO CAPS: 300.0000 mg | ORAL_CAPSULE | Freq: Every day | ORAL | 0 refills | Status: DC

## 2018-12-09 MED ORDER — KETOROLAC TROMETHAMINE 30 MG/ML IJ SOLN
INTRAMUSCULAR | Status: AC
  Filled 2018-12-09: qty 1

## 2018-12-09 MED ORDER — MELOXICAM 7.5 MG PO TABS: 7.5000 mg | ORAL_TABLET | Freq: Every day | ORAL | 0 refills | Status: DC

## 2018-12-09 MED ORDER — KETOROLAC TROMETHAMINE 30 MG/ML IJ SOLN
30.0000 mg | Freq: Once | INTRAMUSCULAR | Status: AC
  Administered 2018-12-09: 30 mg via INTRAMUSCULAR

## 2018-12-09 NOTE — Discharge Instructions (Signed)
Start neurontin and flexeril as directed. These medication may cause drowsiness, do not take if driving, making big decisions. Start Mobic. Do not take ibuprofen (motrin/advil)/ naproxen (aleve) while on mobic. This may help your pain/inflammation of right knee pain. Use knee sleeve during activity. Follow up with PCP for further evaluation and management needed.
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
## 2018-12-09 NOTE — ED Triage Notes (Signed)
Pt reports chronic Fibromyalgia pain that flared up yesterday. Pt also states she is having right knee pain.  Pt has no insurance so she is not currently taking any prescribed medications for this.

## 2018-12-20 ENCOUNTER — Encounter (HOSPITAL_COMMUNITY): Payer: Self-pay | Admitting: Emergency Medicine

## 2018-12-20 ENCOUNTER — Ambulatory Visit (HOSPITAL_COMMUNITY)
Admission: EM | Admit: 2018-12-20 | Discharge: 2018-12-20 | Disposition: A | Discharge status: Home / Self Care | Payer: Self-pay | Attending: Family Medicine | Admitting: Family Medicine

## 2018-12-20 DIAGNOSIS — T2112XA Burn of first degree of abdominal wall, initial encounter: Secondary | ICD-10-CM
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
## 2018-12-24 ENCOUNTER — Encounter (HOSPITAL_COMMUNITY): Payer: Self-pay

## 2018-12-24 ENCOUNTER — Emergency Department (HOSPITAL_COMMUNITY)
Admission: EM | Admit: 2018-12-24 | Discharge: 2018-12-24 | Disposition: A | Discharge status: Home / Self Care | Payer: Medicaid Other | Attending: Emergency Medicine | Admitting: Emergency Medicine

## 2018-12-24 ENCOUNTER — Other Ambulatory Visit: Payer: Self-pay

## 2018-12-24 DIAGNOSIS — T2121XD Burn of second degree of chest wall, subsequent encounter: Secondary | ICD-10-CM | POA: Insufficient documentation

## 2018-12-24 DIAGNOSIS — T300 Burn of unspecified body region, unspecified degree: Secondary | ICD-10-CM

## 2018-12-24 DIAGNOSIS — Z87891 Personal history of nicotine dependence: Secondary | ICD-10-CM | POA: Insufficient documentation

## 2018-12-24 DIAGNOSIS — X101XXD Contact with hot food, subsequent encounter: Secondary | ICD-10-CM | POA: Insufficient documentation

## 2018-12-24 DIAGNOSIS — T2122XD Burn of second degree of abdominal wall, subsequent encounter: Secondary | ICD-10-CM | POA: Insufficient documentation

## 2018-12-24 DIAGNOSIS — Y999 Unspecified external cause status: Secondary | ICD-10-CM | POA: Insufficient documentation

## 2018-12-24 DIAGNOSIS — Y929 Unspecified place or not applicable: Secondary | ICD-10-CM | POA: Insufficient documentation

## 2018-12-24 DIAGNOSIS — Y93G1 Activity, food preparation and clean up: Secondary | ICD-10-CM | POA: Insufficient documentation

## 2018-12-24 DIAGNOSIS — Z79899 Other long term (current) drug therapy: Secondary | ICD-10-CM | POA: Insufficient documentation

## 2018-12-24 MED ORDER — CEPHALEXIN 500 MG PO CAPS: 500.0000 mg | ORAL_CAPSULE | Freq: Four times a day (QID) | ORAL | 0 refills | Status: DC

## 2018-12-24 MED ORDER — BACITRACIN ZINC 500 UNIT/GM EX OINT
TOPICAL_OINTMENT | Freq: Once | CUTANEOUS | Status: AC
  Administered 2018-12-24: 1 via TOPICAL
  Filled 2018-12-24: qty 0.9

## 2018-12-24 MED ORDER — OXYCODONE-ACETAMINOPHEN 5-325 MG PO TABS: 1.0000 | ORAL_TABLET | Freq: Four times a day (QID) | ORAL | 0 refills | Status: DC | PRN
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
## 2019-01-02 ENCOUNTER — Institutional Professional Consult (permissible substitution): Payer: Medicaid Other | Admitting: Plastic Surgery

## 2019-05-02 ENCOUNTER — Other Ambulatory Visit: Payer: Self-pay

## 2019-05-02 ENCOUNTER — Emergency Department (HOSPITAL_COMMUNITY)
Admission: EM | Admit: 2019-05-02 | Discharge: 2019-05-03 | Disposition: A | Discharge status: Home / Self Care | Payer: Self-pay | Attending: Emergency Medicine | Admitting: Emergency Medicine

## 2019-05-02 ENCOUNTER — Encounter (HOSPITAL_COMMUNITY): Payer: Self-pay

## 2019-05-02 DIAGNOSIS — S30870A Other superficial bite of lower back and pelvis, initial encounter: Secondary | ICD-10-CM | POA: Insufficient documentation

## 2019-05-02 DIAGNOSIS — M797 Fibromyalgia: Secondary | ICD-10-CM | POA: Insufficient documentation

## 2019-05-02 DIAGNOSIS — W540XXA Bitten by dog, initial encounter: Secondary | ICD-10-CM | POA: Insufficient documentation

## 2019-05-02 DIAGNOSIS — Z2914 Encounter for prophylactic rabies immune globin: Secondary | ICD-10-CM | POA: Insufficient documentation

## 2019-05-02 DIAGNOSIS — S80872A Other superficial bite, left lower leg, initial encounter: Secondary | ICD-10-CM | POA: Insufficient documentation

## 2019-05-02 DIAGNOSIS — Y92830 Public park as the place of occurrence of the external cause: Secondary | ICD-10-CM | POA: Insufficient documentation

## 2019-05-02 DIAGNOSIS — N898 Other specified noninflammatory disorders of vagina: Secondary | ICD-10-CM

## 2019-05-02 DIAGNOSIS — Y999 Unspecified external cause status: Secondary | ICD-10-CM | POA: Insufficient documentation

## 2019-05-02 DIAGNOSIS — S60572A Other superficial bite of hand of left hand, initial encounter: Secondary | ICD-10-CM | POA: Insufficient documentation

## 2019-05-02 DIAGNOSIS — Y9301 Activity, walking, marching and hiking: Secondary | ICD-10-CM | POA: Insufficient documentation

## 2019-05-02 DIAGNOSIS — Z203 Contact with and (suspected) exposure to rabies: Secondary | ICD-10-CM | POA: Insufficient documentation

## 2019-05-02 DIAGNOSIS — Z23 Encounter for immunization: Secondary | ICD-10-CM | POA: Insufficient documentation

## 2019-05-02 DIAGNOSIS — N899 Noninflammatory disorder of vagina, unspecified: Secondary | ICD-10-CM | POA: Insufficient documentation

## 2019-05-02 DIAGNOSIS — Z87891 Personal history of nicotine dependence: Secondary | ICD-10-CM | POA: Insufficient documentation

## 2019-05-02 DIAGNOSIS — S60571A Other superficial bite of hand of right hand, initial encounter: Secondary | ICD-10-CM | POA: Insufficient documentation
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
## 2019-05-02 NOTE — ED Notes (Signed)
Pt states that she would also like to be checked and treated for a bacterial infection

## 2019-05-03 ENCOUNTER — Emergency Department (HOSPITAL_COMMUNITY): Payer: Self-pay

## 2019-05-03 LAB — URINALYSIS, MICROSCOPIC (REFLEX)

## 2019-05-03 LAB — URINALYSIS, ROUTINE W REFLEX MICROSCOPIC
Bilirubin Urine: NEGATIVE
Glucose, UA: NEGATIVE mg/dL
Ketones, ur: 15 mg/dL — AB
Nitrite: NEGATIVE
Protein, ur: 100 mg/dL — AB
Specific Gravity, Urine: >1.03 — ABNORMAL HIGH (ref 1.005–1.030)
pH: 5.5 (ref 5.0–8.0)

## 2019-05-03 LAB — PREGNANCY, URINE: Preg Test, Ur: NEGATIVE

## 2019-05-03 MED ORDER — RABIES IMMUNE GLOBULIN 150 UNIT/ML IM INJ
20.0000 [IU]/kg | INJECTION | Freq: Once | INTRAMUSCULAR | Status: AC
  Administered 2019-05-03: 1800 [IU] via INTRAMUSCULAR
  Filled 2019-05-03: qty 12

## 2019-05-03 MED ORDER — TETANUS-DIPHTH-ACELL PERTUSSIS 5-2.5-18.5 LF-MCG/0.5 IM SUSP
0.5000 mL | Freq: Once | INTRAMUSCULAR | Status: AC
  Administered 2019-05-03: 0.5 mL via INTRAMUSCULAR
  Filled 2019-05-03: qty 0.5

## 2019-05-03 MED ORDER — AMOXICILLIN-POT CLAVULANATE 875-125 MG PO TABS: 1.0000 | ORAL_TABLET | Freq: Two times a day (BID) | ORAL | 0 refills | Status: DC

## 2019-05-03 MED ORDER — RABIES VACCINE, PCEC IM SUSR
1.0000 mL | Freq: Once | INTRAMUSCULAR | Status: AC
  Administered 2019-05-03: 1 mL via INTRAMUSCULAR
  Filled 2019-05-03: qty 1

## 2019-05-03 MED ORDER — OXYCODONE-ACETAMINOPHEN 5-325 MG PO TABS
1.0000 | ORAL_TABLET | Freq: Once | ORAL | Status: AC
  Administered 2019-05-03: 1 via ORAL
  Filled 2019-05-03: qty 1

## 2019-05-03 MED ORDER — HYDROCODONE-ACETAMINOPHEN 5-325 MG PO TABS: 1.0000 | ORAL_TABLET | ORAL | 0 refills | Status: DC | PRN

## 2019-05-03 MED ORDER — AMOXICILLIN-POT CLAVULANATE 875-125 MG PO TABS
1.0000 | ORAL_TABLET | Freq: Once | ORAL | Status: AC
  Administered 2019-05-03: 01:00:00 1 via ORAL
  Filled 2019-05-03: qty 1
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
## 2019-05-03 NOTE — ED Provider Notes (Signed)
23 year old [facility].  Per his HPI:  "Stephanie Bean is a 23 y.o. female reports that she was attacked by an unknown dog while walking in the park today.  Dog escaped she was unable to track the animal.  She has multiple small lacerations to bilateral hands, left lower leg, right buttock.  Bleeding controlled with direct pressure prior to arrival.  Unknown rabies status of the animal.  Patient reports that she maintain her balance during the attack and denies any other injury today.  Patient is unaware of her last Tdap shot and believes that she is due for another 1.  Denies headache, loss consciousness, blood thinner use, neck pain, back pain, chest pain, abdominal pain, nausea/vomiting or any additional concerns."   Physical Exam  BP (!) 152/104 (BP Location: Right Arm)   Pulse 95   Temp 98.9 F (37.2 C) (Oral)   Resp 16   Ht 5\' 7"  (1.702 m)   Wt 88.5 kg   LMP 04/25/2019   SpO2 100%   BMI 30.54 kg/m   Physical Exam Vitals signs and nursing note reviewed.  Constitutional:      General: She is not in acute distress.    Appearance: She is not ill-appearing, toxic-appearing or diaphoretic.  HENT:     Head: Normocephalic.  Eyes:     Conjunctiva/sclera: Conjunctivae normal.  Neck:     Musculoskeletal: Neck supple.  Cardiovascular:     Rate and Rhythm: Normal rate and regular rhythm.     Heart sounds: No murmur. No friction rub. No gallop.   Pulmonary:     Effort: Pulmonary effort is normal. No respiratory distress.  Abdominal:     General: There is no distension.     Palpations: Abdomen is soft.  Musculoskeletal:     Comments: Multiple wounds noted including to the bilateral palms, left lower leg, and right buttocks.  Wounds are hemostatic.  No obvious foreign bodies.  Skin:    General: Skin is warm.     Findings: No rash.  Neurological:     Mental Status: She is alert.  Psychiatric:        Behavior: Behavior normal.     ED  Course/Procedures     Procedures  MDM   23 [facility].  Patient was bitten by an unknown dog earlier today.  Tdap updated.  Pregnancy test is negative.  Rabies series initiated in the ER since dog is unknown and unable to be captured for observation by animal control.  Please see PA Morelli's note for further work-up and medical decision making.  Wounds were copiously irrigated and Steri-Strips applied.  Home wound care instructions given by me.  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  She denies any abdominal pain, fevers, or chills.  Will give referral to walk-in gynecology clinic as I have a low suspicion for PID at this time given recurrence and chronicity of her symptoms.  We will also discharge the patient with Augmentin for infection prophylaxis.  First dose given in the ER.  She was also discharged with a short course of Vicodin for pain control, but strongly encourage the patient to use ice, Tylenol, and ibuprofen as a first-line treatment.  Return precautions to the ER given.  A 3442-month prescription history query was performed using the [place] CSRS prior to discharge. She is hemodynamically stable and  in no acute distress.  Safe for discharge to home with outpatient follow-up for the remainder of the rabies vaccination series.       Joanne Gavel, PA-C 05/03/19 6629    Ezequiel Essex, MD 05/03/19 [phone]

## 2019-05-03 NOTE — Discharge Instructions (Signed)
Thank you for allowing me to care for you today in the Emergency Department.   You were seen in the ER today for dog bite.  You were given the first in the rabies series.  There is a letter attached that has the dates for the remaining shots in the series.  Follow as directed.  To clean your wounds at home, keep the area clean with warm water and soap.  Then apply a topical antibiotic, such as bacitracin or Neosporin.  You can then apply a bandage or a Steri-Strip to the area.  Avoid submerging your wounds in water, such as in a bathtub, pool, or hot tub, until the wounds are fully closed.  Take Tylenol and ibuprofen as directed on the label for pain.  You also apply an ice pack to areas that are sore for 15 to 20 minutes as frequently as needed.  Monitor your wounds for signs of redness, warmth, or swelling, or if you develop fever or chills, return to the emergency department as they may be signs that your wounds are infected.  I have provided you with a referral to the walk-in gynecology clinic to follow-up regarding vaginal discharge.

## 2019-05-05 ENCOUNTER — Other Ambulatory Visit: Payer: Self-pay

## 2019-05-05 ENCOUNTER — Ambulatory Visit (HOSPITAL_COMMUNITY)
Admission: EM | Admit: 2019-05-05 | Discharge: 2019-05-05 | Disposition: A | Discharge status: Home / Self Care | Payer: Self-pay | Attending: Family Medicine | Admitting: Family Medicine

## 2019-05-05 DIAGNOSIS — N898 Other specified noninflammatory disorders of vagina: Secondary | ICD-10-CM

## 2019-05-05 DIAGNOSIS — Z23 Encounter for immunization: Secondary | ICD-10-CM

## 2019-05-05 DIAGNOSIS — Z203 Contact with and (suspected) exposure to rabies: Secondary | ICD-10-CM

## 2019-05-05 MED ORDER — RABIES VACCINE, PCEC IM SUSR
1.0000 mL | Freq: Once | INTRAMUSCULAR | Status: AC
  Administered 2019-05-05: 1 mL via INTRAMUSCULAR

## 2019-05-05 MED ORDER — FLUCONAZOLE 150 MG PO TABS: 150.0000 mg | ORAL_TABLET | Freq: Every day | ORAL | 0 refills | Status: DC

## 2019-05-05 MED ORDER — METRONIDAZOLE 500 MG PO TABS: 500.0000 mg | ORAL_TABLET | Freq: Two times a day (BID) | ORAL | 0 refills | Status: DC

## 2019-05-05 MED ORDER — RABIES VACCINE, PCEC IM SUSR
INTRAMUSCULAR | Status: AC
  Filled 2019-05-05: qty 1

## 2019-05-05 NOTE — Discharge Instructions (Addendum)
Treating you for BV and yeast Follow up with the health department.

## 2019-05-05 NOTE — ED Triage Notes (Signed)
Pt present vaginal discharge. Symptoms started a week ago. Pt is also in need of her rabies shot.
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
## 2019-05-09 ENCOUNTER — Ambulatory Visit (HOSPITAL_COMMUNITY)
Admission: EM | Admit: 2019-05-09 | Discharge: 2019-05-09 | Disposition: A | Discharge status: Home / Self Care | Payer: Self-pay | Attending: Internal Medicine | Admitting: Internal Medicine

## 2019-05-09 ENCOUNTER — Encounter (HOSPITAL_COMMUNITY): Payer: Self-pay | Admitting: Emergency Medicine

## 2019-05-09 ENCOUNTER — Other Ambulatory Visit: Payer: Self-pay

## 2019-05-09 DIAGNOSIS — Z23 Encounter for immunization: Secondary | ICD-10-CM

## 2019-05-09 DIAGNOSIS — Z203 Contact with and (suspected) exposure to rabies: Secondary | ICD-10-CM

## 2019-05-09 MED ORDER — RABIES VACCINE, PCEC IM SUSR
INTRAMUSCULAR | Status: AC
  Filled 2019-05-09: qty 1

## 2019-05-09 MED ORDER — RABIES VACCINE, PCEC IM SUSR
1.0000 mL | Freq: Once | INTRAMUSCULAR | Status: AC
  Administered 2019-05-09: 1 mL via INTRAMUSCULAR

## 2019-05-09 NOTE — ED Triage Notes (Signed)
Rabies injection, receiving series

## 2019-05-30 IMAGING — DX DG LUMBAR SPINE COMPLETE 4+V
4 series · 4 of 4 positions shown · non-contrast
Comparison: CT 06/30/2016.

CLINICAL DATA: Chronic back pain.

EXAM:
LUMBAR SPINE - COMPLETE 4+ VIEW

[l-spine ap]
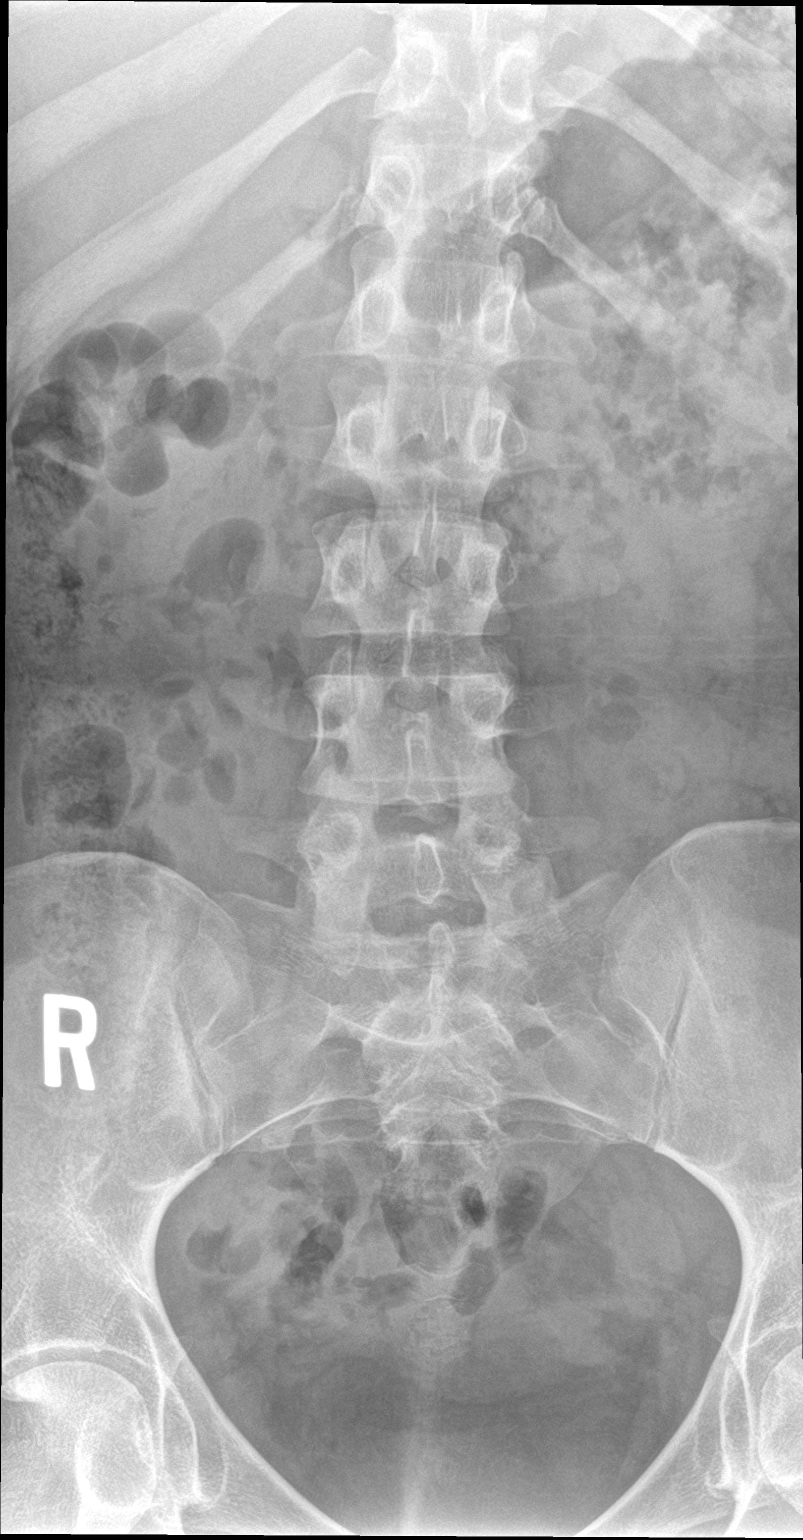

[l-spine obl (1 of 2)]
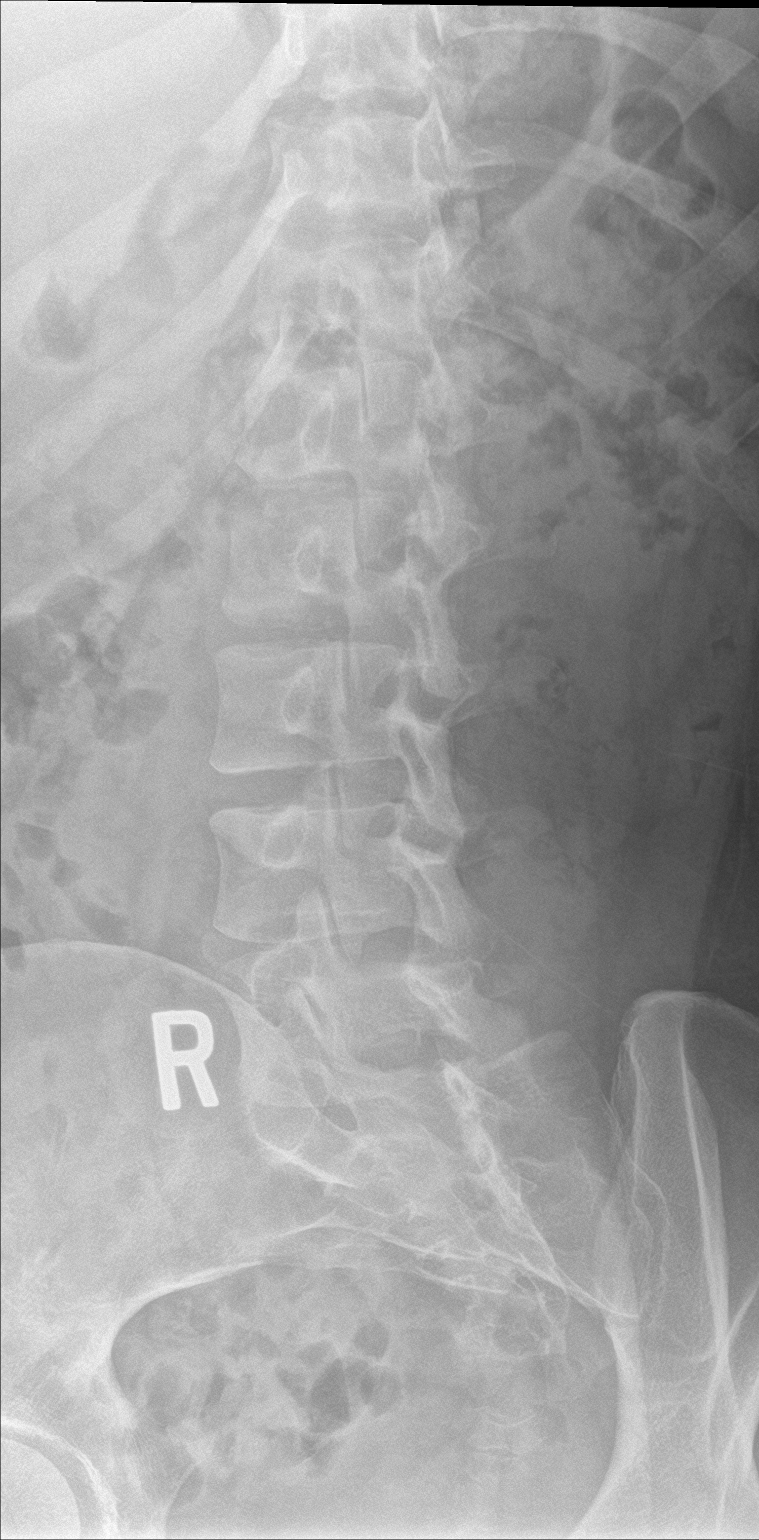

[l-spine obl (2 of 2)]
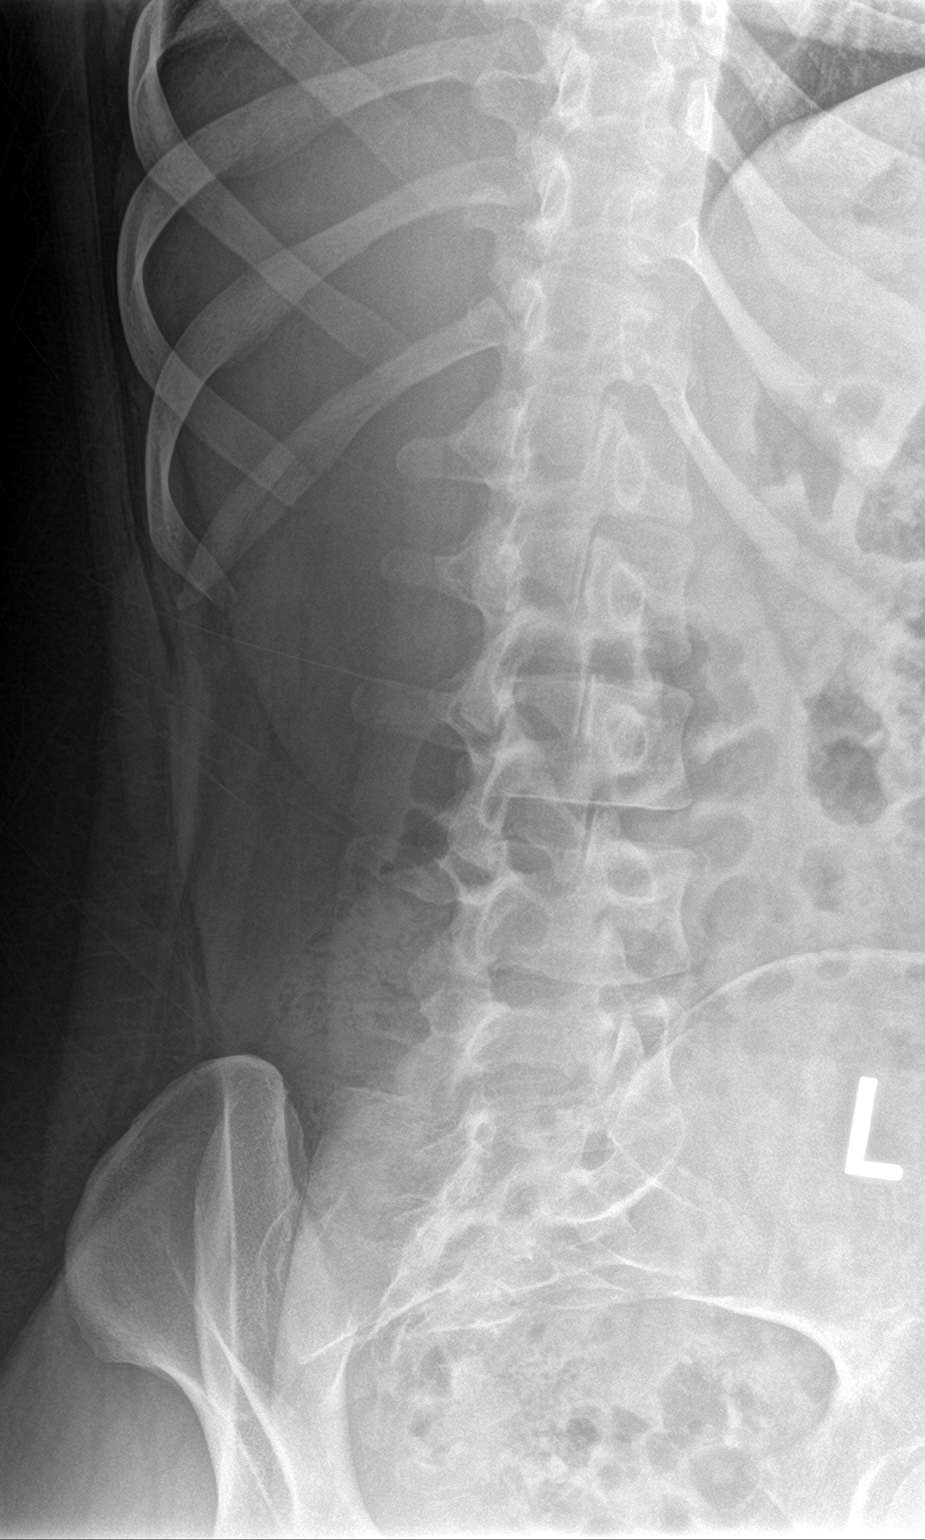

[l-spine lat]
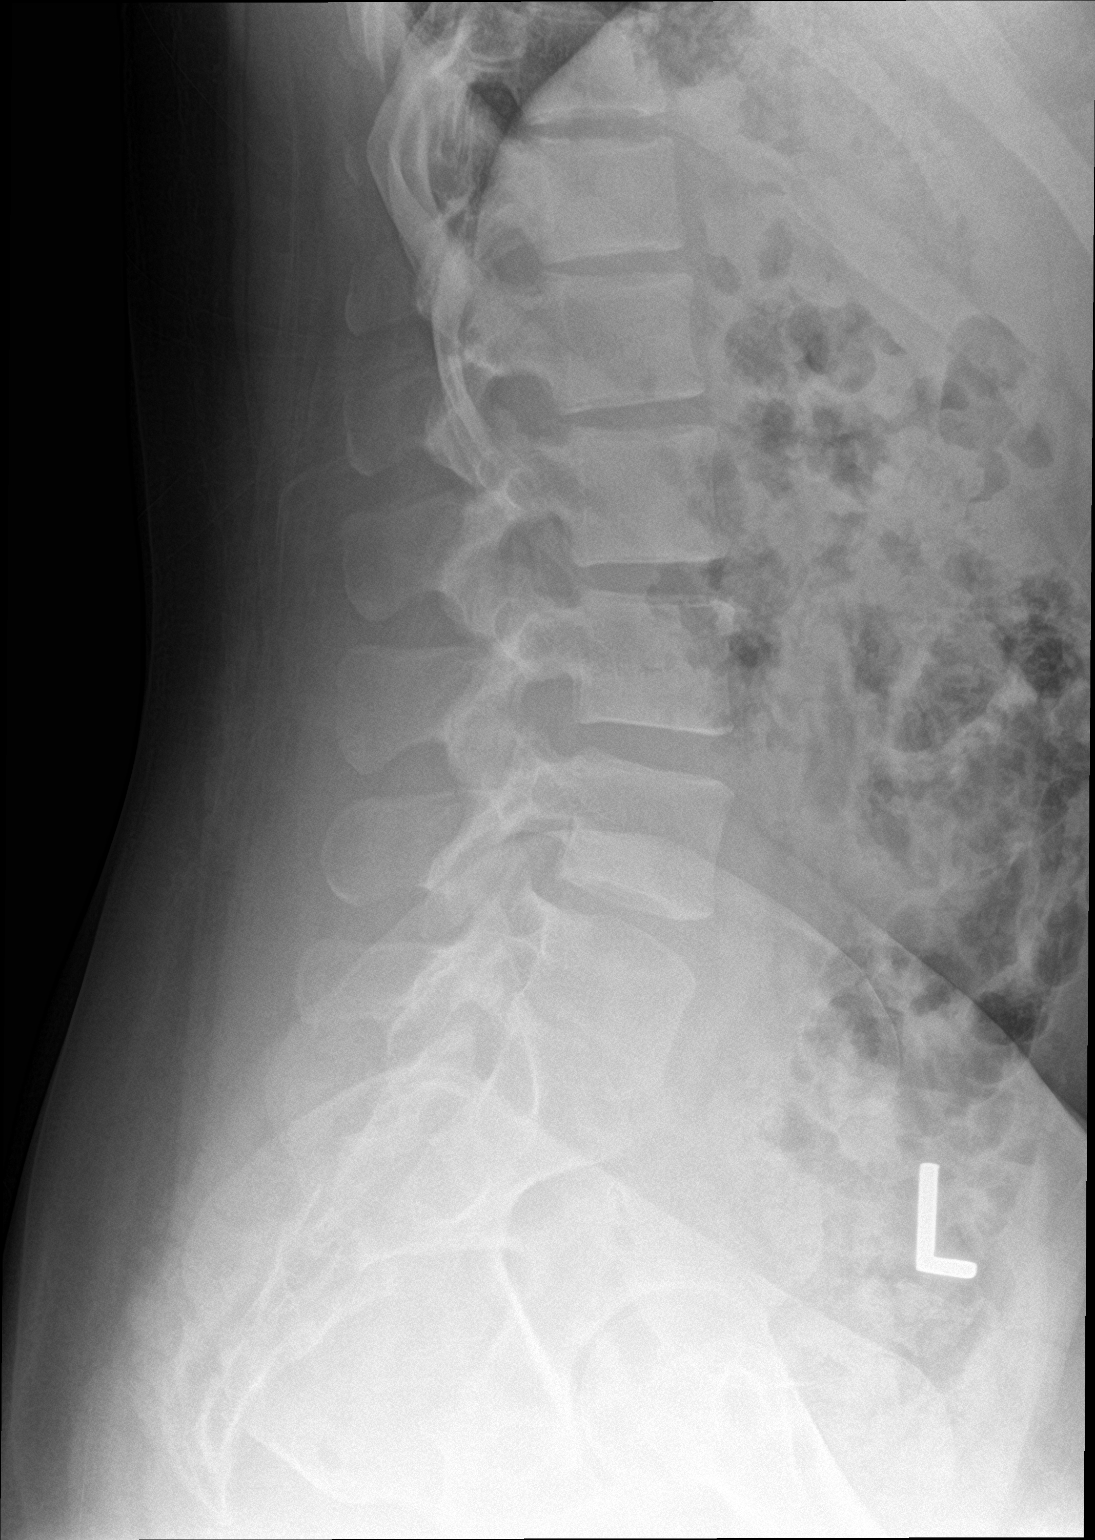

[4 of 4 positions shown; findings below may reference images not displayed]

FINDINGS: Mild lumbar scoliosis concave left. No acute bony abnormality. No
evidence of fracture. No acute bony abnormality.
IMPRESSION: Mild lumbar scoliosis concave left.  No acute bony abnormality.

## 2019-08-04 ENCOUNTER — Other Ambulatory Visit: Payer: Self-pay

## 2019-08-04 ENCOUNTER — Encounter (HOSPITAL_COMMUNITY): Payer: Self-pay

## 2019-08-04 ENCOUNTER — Ambulatory Visit (HOSPITAL_COMMUNITY)
Admission: EM | Admit: 2019-08-04 | Discharge: 2019-08-04 | Disposition: A | Discharge status: Home / Self Care | Payer: Self-pay | Attending: Family Medicine | Admitting: Family Medicine

## 2019-08-04 DIAGNOSIS — M273 Alveolitis of jaws: Secondary | ICD-10-CM

## 2019-08-04 MED ORDER — KETOROLAC TROMETHAMINE 60 MG/2ML IM SOLN
INTRAMUSCULAR | Status: AC
  Filled 2019-08-04: qty 2

## 2019-08-04 MED ORDER — TRAMADOL HCL 50 MG PO TABS: 50.0000 mg | ORAL_TABLET | Freq: Four times a day (QID) | ORAL | 0 refills | Status: AC | PRN

## 2019-08-04 MED ORDER — KETOROLAC TROMETHAMINE 60 MG/2ML IM SOLN
60.0000 mg | Freq: Once | INTRAMUSCULAR | Status: AC
  Administered 2019-08-04: 13:00:00 60 mg via INTRAMUSCULAR
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
## 2019-08-04 NOTE — Discharge Instructions (Addendum)
Treating your pain for dry socket.  Toradol given here for inflammation and swelling Tramadol as needed for severe pain.  You need to follow up with a dentist if this continues.

## 2019-08-04 NOTE — ED Triage Notes (Signed)
Pt presents with right side dental pain and facial swelling X 2 days.  Pt states she had a tooth pulled on that side earlier this week and believes it is dry socket; pt states all OTC medications are not helping with pain.

## 2020-01-19 ENCOUNTER — Other Ambulatory Visit: Payer: Self-pay

## 2020-01-19 ENCOUNTER — Encounter (HOSPITAL_COMMUNITY): Payer: Self-pay

## 2020-01-19 ENCOUNTER — Ambulatory Visit (HOSPITAL_COMMUNITY)
Admission: EM | Admit: 2020-01-19 | Discharge: 2020-01-19 | Disposition: A | Discharge status: Home / Self Care | Payer: HRSA Program

## 2020-01-19 DIAGNOSIS — N3000 Acute cystitis without hematuria: Secondary | ICD-10-CM | POA: Diagnosis not present

## 2020-01-19 DIAGNOSIS — U071 COVID-19: Secondary | ICD-10-CM

## 2020-01-19 LAB — POCT RAPID STREP A: Streptococcus, Group A Screen (Direct): NEGATIVE

## 2020-01-19 LAB — POCT URINALYSIS DIP (DEVICE)
Bilirubin Urine: NEGATIVE
Glucose, UA: NEGATIVE mg/dL
Hgb urine dipstick: NEGATIVE
Ketones, ur: NEGATIVE mg/dL
Nitrite: POSITIVE — AB
Protein, ur: NEGATIVE mg/dL
Specific Gravity, Urine: 1.025 (ref 1.005–1.030)
Urobilinogen, UA: 0.2 mg/dL (ref 0.0–1.0)
pH: 5.5 (ref 5.0–8.0)

## 2020-01-19 MED ORDER — BENZONATATE 100 MG PO CAPS: 100.0000 mg | ORAL_CAPSULE | Freq: Three times a day (TID) | ORAL | 0 refills | Status: DC

## 2020-01-19 MED ORDER — CEPHALEXIN 500 MG PO CAPS: 500.0000 mg | ORAL_CAPSULE | Freq: Two times a day (BID) | ORAL | 0 refills | Status: AC

## 2020-01-19 MED ORDER — ALBUTEROL SULFATE HFA 108 (90 BASE) MCG/ACT IN AERS: 2.0000 | INHALATION_SPRAY | Freq: Once | RESPIRATORY_TRACT | Status: DC

## 2020-01-19 MED ORDER — HYDROXYZINE HCL 25 MG PO TABS: 25.0000 mg | ORAL_TABLET | Freq: Four times a day (QID) | ORAL | 0 refills | Status: DC

## 2020-01-19 MED ORDER — ONDANSETRON HCL 4 MG PO TABS: 4.0000 mg | ORAL_TABLET | Freq: Three times a day (TID) | ORAL | 0 refills | Status: DC | PRN
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
## 2020-01-19 NOTE — Discharge Instructions (Signed)
Start the Keflex tonight and take it twice a day for 7 days Use the Tessalon for cough up to 3 times a day Hydroxyzine can help with your nighttime anxiety so take this at night.  If your having worsening symptoms such as worse chest pain, or shortness of breath or have a very high fever please return or report to the emergency department.

## 2020-01-19 NOTE — ED Triage Notes (Addendum)
Pt presents to UC with shortness of breath, cough, nasal congestion x 4 day; increase urinary frequency x 2 days, Pt took AZO gives somewhat relief. Pt states she started having throbbing sharp chest pain. Pt tested positive for COVID 2 days ago.

## 2021-01-30 ENCOUNTER — Encounter: Payer: Self-pay | Admitting: Emergency Medicine

## 2021-01-30 ENCOUNTER — Emergency Department (INDEPENDENT_AMBULATORY_CARE_PROVIDER_SITE_OTHER)
Admission: EM | Admit: 2021-01-30 | Discharge: 2021-01-30 | Disposition: A | Discharge status: Home / Self Care | Payer: PRIVATE HEALTH INSURANCE | Source: Home / Self Care

## 2021-01-30 ENCOUNTER — Other Ambulatory Visit: Payer: Self-pay

## 2021-01-30 ENCOUNTER — Other Ambulatory Visit (HOSPITAL_COMMUNITY)
Admission: RE | Admit: 2021-01-30 | Discharge: 2021-01-30 | Disposition: A | Discharge status: Home / Self Care | Payer: 59 | Source: Ambulatory Visit | Attending: Family Medicine | Admitting: Family Medicine

## 2021-01-30 DIAGNOSIS — B009 Herpesviral infection, unspecified: Secondary | ICD-10-CM

## 2021-01-30 DIAGNOSIS — M797 Fibromyalgia: Secondary | ICD-10-CM

## 2021-01-30 DIAGNOSIS — Z113 Encounter for screening for infections with a predominantly sexual mode of transmission: Secondary | ICD-10-CM

## 2021-01-30 DIAGNOSIS — N760 Acute vaginitis: Secondary | ICD-10-CM

## 2021-01-30 DIAGNOSIS — G6289 Other specified polyneuropathies: Secondary | ICD-10-CM

## 2021-01-30 LAB — POCT URINALYSIS DIP (MANUAL ENTRY)
Bilirubin, UA: NEGATIVE
Glucose, UA: NEGATIVE mg/dL
Ketones, POC UA: NEGATIVE mg/dL
Leukocytes, UA: NEGATIVE
Nitrite, UA: NEGATIVE
Protein Ur, POC: NEGATIVE mg/dL
Spec Grav, UA: 1.025 (ref 1.010–1.025)
Urobilinogen, UA: 0.2 E.U./dL
pH, UA: 7 (ref 5.0–8.0)

## 2021-01-30 LAB — POCT URINE PREGNANCY: Preg Test, Ur: NEGATIVE

## 2021-01-30 MED ORDER — VALACYCLOVIR HCL 1 G PO TABS: 1000.0000 mg | ORAL_TABLET | Freq: Three times a day (TID) | ORAL | 0 refills | Status: AC

## 2021-01-30 MED ORDER — GABAPENTIN 300 MG PO CAPS: 300.0000 mg | ORAL_CAPSULE | Freq: Three times a day (TID) | ORAL | 0 refills | Status: DC

## 2021-01-30 NOTE — ED Triage Notes (Signed)
[place] discharge vaginally Requesting STD testing  Nerve pain to feet - hx of fibromyalgia looking for a pcp

## 2021-01-30 NOTE — Discharge Instructions (Addendum)
I have prescribed Valtrex for you to take 3 times a day for 7 days.  I have also sent in gabapentin for you to take 3 times a day as needed for nerve pain  We have given you some information to help get set up with primary care  Urine today was unremarkable, no sugar, no infection  Pregnancy test is negative today  Swab testing will be back within about 2 to 3 days, we will inform you with any abnormal results that require further treatment.  Do not have sex until labs are back and negative or until treatment is completed, whichever is longer.  Blood test will be back probably overnight tonight.  Follow up with this office or with primary care if symptoms are persisting.  Follow up in the ER for high fever, trouble swallowing, trouble breathing, other concerning symptoms.
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
## 2021-01-31 LAB — RPR: RPR Ser Ql: NONREACTIVE

## 2021-01-31 LAB — HIV ANTIBODY (ROUTINE TESTING W REFLEX): HIV 1&2 Ab, 4th Generation: NONREACTIVE

## 2021-02-03 LAB — CERVICOVAGINAL ANCILLARY ONLY
Bacterial Vaginitis (gardnerella): NEGATIVE
Candida Glabrata: NEGATIVE
Candida Vaginitis: NEGATIVE
Chlamydia: NEGATIVE
Comment: NEGATIVE
Comment: NEGATIVE
Comment: NEGATIVE
Comment: NEGATIVE
Comment: NEGATIVE
Comment: NORMAL
Neisseria Gonorrhea: NEGATIVE
Trichomonas: NEGATIVE

## 2021-03-11 ENCOUNTER — Other Ambulatory Visit: Payer: Self-pay

## 2021-03-11 ENCOUNTER — Encounter: Payer: Self-pay | Admitting: Family Medicine

## 2021-03-11 ENCOUNTER — Ambulatory Visit (INDEPENDENT_AMBULATORY_CARE_PROVIDER_SITE_OTHER): Payer: 59 | Admitting: Family Medicine

## 2021-03-11 VITALS — BP 138/80 | HR 105 | Ht 67.0 in | Wt 208.0 lb

## 2021-03-11 DIAGNOSIS — M797 Fibromyalgia: Secondary | ICD-10-CM | POA: Diagnosis not present

## 2021-03-11 DIAGNOSIS — R41840 Attention and concentration deficit: Secondary | ICD-10-CM | POA: Diagnosis not present

## 2021-03-11 DIAGNOSIS — F4310 Post-traumatic stress disorder, unspecified: Secondary | ICD-10-CM | POA: Diagnosis not present

## 2021-03-11 DIAGNOSIS — F418 Other specified anxiety disorders: Secondary | ICD-10-CM

## 2021-03-11 MED ORDER — PREGABALIN 75 MG PO CAPS: 75.0000 mg | ORAL_CAPSULE | Freq: Two times a day (BID) | ORAL | 3 refills | Status: DC

## 2021-03-11 MED ORDER — DULOXETINE HCL 30 MG PO CPEP: 30.0000 mg | ORAL_CAPSULE | Freq: Every day | ORAL | 3 refills | Status: DC

## 2021-03-11 MED ORDER — ONDANSETRON 4 MG PO TBDP: 4.0000 mg | ORAL_TABLET | Freq: Three times a day (TID) | ORAL | 0 refills | Status: AC | PRN
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
## 2021-03-11 NOTE — Assessment & Plan Note (Addendum)
Severe depression with anxiety.  Likely has component of PTSD as well.  Denies SI at this time.  Restarting cymbalta Referral for counseling Referral to [place] [facility] as well for evaluation of ADD.

## 2021-03-11 NOTE — Assessment & Plan Note (Signed)
Restart lyrica and cymbalta.  Would benefit from regular exercise.

## 2021-03-11 NOTE — Patient Instructions (Addendum)
Great to meet you today! Let's try adding lyrica and cymbalta back on at first.  I have ordered a referral for ADD testing and to set up to see a therapist.  Please schedule appt with Dr. Benjamin Stain for knee.

## 2021-03-26 ENCOUNTER — Other Ambulatory Visit: Payer: Self-pay

## 2021-03-26 ENCOUNTER — Emergency Department (INDEPENDENT_AMBULATORY_CARE_PROVIDER_SITE_OTHER): Payer: Commercial Managed Care - PPO

## 2021-03-26 ENCOUNTER — Emergency Department (INDEPENDENT_AMBULATORY_CARE_PROVIDER_SITE_OTHER)
Admission: RE | Admit: 2021-03-26 | Discharge: 2021-03-26 | Disposition: A | Discharge status: Home / Self Care | Payer: 59 | Source: Ambulatory Visit

## 2021-03-26 ENCOUNTER — Ambulatory Visit: Payer: Self-pay

## 2021-03-26 VITALS — BP 134/85 | HR 119 | Temp 98.6°F | Resp 20 | Ht 67.0 in | Wt 208.0 lb

## 2021-03-26 DIAGNOSIS — F411 Generalized anxiety disorder: Secondary | ICD-10-CM

## 2021-03-26 DIAGNOSIS — M79662 Pain in left lower leg: Secondary | ICD-10-CM | POA: Diagnosis not present

## 2021-03-26 DIAGNOSIS — M7989 Other specified soft tissue disorders: Secondary | ICD-10-CM | POA: Diagnosis not present

## 2021-03-26 DIAGNOSIS — R079 Chest pain, unspecified: Secondary | ICD-10-CM | POA: Diagnosis not present

## 2021-03-26 DIAGNOSIS — M79605 Pain in left leg: Secondary | ICD-10-CM | POA: Diagnosis not present

## 2021-03-26 NOTE — Discharge Instructions (Addendum)
No abnormalities on EKG or ultrasound - these do not fully rule out your heart  or a clot as the cause of your symptoms but make them less likely.  Suspect chest tightness and fatigue are related to your anxiety and your leg pain is related to your fibromyalgia or neuropathy.  Follow-up with PCP for further evaluation/management. Go to the ER if develop severe or worsening symptoms such as persistent chest pain/difficulty breathing, pass out, or other new concerning symptoms.

## 2021-03-26 NOTE — ED Triage Notes (Addendum)
Pt presents to Urgent Care with c/o intermittent L lower leg pain/cramping and chest pain (which sometimes radiates to L arm) x several months. Denies current cp. Also c/o occasional sob and feeling "jittery." Reports fatigue as well.
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
## 2021-04-02 ENCOUNTER — Other Ambulatory Visit: Payer: Self-pay | Admitting: Family Medicine

## 2021-04-13 ENCOUNTER — Emergency Department (INDEPENDENT_AMBULATORY_CARE_PROVIDER_SITE_OTHER)
Admission: EM | Admit: 2021-04-13 | Discharge: 2021-04-13 | Disposition: A | Discharge status: Home / Self Care | Payer: Commercial Managed Care - PPO | Source: Home / Self Care

## 2021-04-13 ENCOUNTER — Encounter: Payer: Self-pay | Admitting: Emergency Medicine

## 2021-04-13 ENCOUNTER — Other Ambulatory Visit: Payer: Self-pay

## 2021-04-13 DIAGNOSIS — T63441A Toxic effect of venom of bees, accidental (unintentional), initial encounter: Secondary | ICD-10-CM

## 2021-04-13 HISTORY — DX: Gastric ulcer, unspecified as acute or chronic, without hemorrhage or perforation: K25.9

## 2021-04-13 NOTE — Discharge Instructions (Addendum)
Patient may take OTC Benadryl 25-50 mg  every 6 hours.  Encourage patient to RICE affected areas of the left lower leg for 20 minutes 2-3 times daily for comfort/decrease pain.  Patient is unable to take Ibuprofen due to stomach ulcers.  Advised/encouraged patient if left lower leg worsens please go to nearest ED for further evaluation.
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
## 2021-04-13 NOTE — ED Triage Notes (Signed)
Yesterday patient's boyfriend was doing yard work and got stung by yellow jackets about 10 times; patient went to his aid and felt something on left inner lower leg; could not find stinger; by this morning area is so sore she cannot bear weight on left foot. Cannot take many OTCs and rxs due to ulcers. Did try icing and elevating it. Has not had any covid vaccinations.

## 2021-09-24 ENCOUNTER — Other Ambulatory Visit: Payer: Self-pay | Admitting: Family Medicine

## 2021-09-26 NOTE — Telephone Encounter (Signed)
Med filled for 1 mo but needs 6 mo f/u scheduled.   Meds ordered this encounter  Medications   pregabalin (LYRICA) 75 MG capsule    Sig: TAKE 1 CAPSULE BY MOUTH TWICE A DAY    Dispense:  60 capsule    Refill:  0    This request is for a new prescription for a controlled substance as required by Federal/State law.Marland Kitchen

## 2022-02-17 ENCOUNTER — Telehealth: Payer: Self-pay | Admitting: General Practice

## 2022-02-17 NOTE — Telephone Encounter (Signed)
Transition Care Management Unsuccessful Follow-up Telephone Call ? ?Date of discharge and from where:  02/17/22 from [place] ? ?Attempts:  1st Attempt ? ?Reason for unsuccessful TCM follow-up call:  Voice mail full ? ?  ?

## 2022-02-18 NOTE — Telephone Encounter (Signed)
Transition Care Management Unsuccessful Follow-up Telephone Call ? ?Date of discharge and from where:  02/17/22 from [facility] ? ?Attempts:  2nd Attempt ? ?Reason for unsuccessful TCM follow-up call:  Voice mail full ? ?  ?

## 2022-02-23 NOTE — Telephone Encounter (Signed)
Transition Care Management Unsuccessful Follow-up Telephone Call ? ?Date of discharge and from where:  02/17/22 from [facility] ? ?Attempts:  3rd Attempt ? ?Reason for unsuccessful TCM follow-up call:  Voice mail full ? ?  ?

## 2022-03-16 ENCOUNTER — Telehealth: Payer: Self-pay | Admitting: General Practice

## 2022-03-16 NOTE — Telephone Encounter (Signed)
Transition Care Management Follow-up Telephone Call Date of discharge and from where: 03/16/22 from [facility] How have you been since you were released from the hospital? Doing better. Any questions or concerns? No  Items Reviewed: Did the pt receive and understand the discharge instructions provided? Yes  Medications obtained and verified? No  Other? No  Any new allergies since your discharge? No  Dietary orders reviewed? Yes Do you have support at home? Yes   Home Care and Equipment/Supplies: Were home health services ordered? no  Functional Questionnaire: (I = Independent and D = Dependent) ADLs: I  Bathing/Dressing- I  Meal Prep- I  Eating- I  Maintaining continence- I  Transferring/Ambulation- I  Managing Meds- I  Follow up appointments reviewed:  PCP Hospital f/u appt confirmed? No  Patient will call back to schedule. Specialist Hospital f/u appt confirmed? No   Are transportation arrangements needed? No  If their condition worsens, is the pt aware to call PCP or go to the Emergency Dept.? Yes Was the patient provided with contact information for the PCP's office or ED? Yes Was to pt encouraged to call back with questions or concerns? Yes

## 2022-03-24 ENCOUNTER — Other Ambulatory Visit: Payer: Self-pay | Admitting: Family Medicine

## 2022-05-05 ENCOUNTER — Telehealth: Payer: Self-pay | Admitting: General Practice

## 2022-05-05 NOTE — Telephone Encounter (Signed)
Transition Care Management Unsuccessful Follow-up Telephone Call  Date of discharge and from where:  05/01/22 from [facility]  Attempts:  1st Attempt  Reason for unsuccessful TCM follow-up call:  Voice mail full    

## 2022-05-06 NOTE — Telephone Encounter (Signed)
Transition Care Management Unsuccessful Follow-up Telephone Call  Date of discharge and from where:  05/01/22 from [facility]  Attempts:  2nd Attempt  Reason for unsuccessful TCM follow-up call:  Voice mail full    

## 2022-05-12 NOTE — Telephone Encounter (Signed)
Transition Care Management Unsuccessful Follow-up Telephone Call  Date of discharge and from where:  05/01/22 from [facility]  Attempts:  3rd Attempt  Reason for unsuccessful TCM follow-up call:  Voice mail full    

## 2022-10-13 ENCOUNTER — Telehealth: Payer: Self-pay | Admitting: General Practice

## 2022-10-13 NOTE — Telephone Encounter (Signed)
Transition Care Management Follow-up Telephone Call Date of discharge and from where: 10/12/22 from [facility] How have you been since you were released from the hospital? Patient is doing ok. But she is trying to find a new PCP closer to home but if she does not will call back to make an appt with Stephanie Bean. Any questions or concerns? No  Items Reviewed: Did the pt receive and understand the discharge instructions provided? Yes  Medications obtained and verified? No  Other? No  Any new allergies since your discharge? No  Dietary orders reviewed? Yes Do you have support at home? Yes   Home Care and Equipment/Supplies: Were home health services ordered? no  Functional Questionnaire: (I = Independent and D = Dependent) ADLs: I  Bathing/Dressing- I  Meal Prep- I  Eating- I  Maintaining continence- I  Transferring/Ambulation- I  Managing Meds- I  Follow up appointments reviewed:  PCP Hospital f/u appt confirmed? No  Patient said that she will call back to schedule. [facility] f/u appt confirmed? No   Are transportation arrangements needed? No  If their condition worsens, is the pt aware to call PCP or go to the Emergency Dept.? Yes Was the patient provided with contact information for the PCP's office or ED? Yes Was to pt encouraged to call back with questions or concerns? Yes

## 2022-12-07 ENCOUNTER — Telehealth: Payer: Self-pay | Admitting: General Practice

## 2022-12-07 NOTE — Transitions of Care (Post Inpatient/ED Visit) (Signed)
   12/07/2022  Name: Keitra Christ MRN: [id number] DOB: September 11, 1996  Today's TOC FU Call Status: Today's TOC FU Call Status:: Unsuccessul Call (1st Attempt) Unsuccessful Call (1st Attempt) Date: 12/07/22  Attempted to reach the patient regarding the most recent Inpatient/ED visit.  Follow Up Plan: Additional outreach attempts will be made to reach the patient to complete the Transitions of Care (Post Inpatient/ED visit) call.   Signature Tinnie Gens, RN

## 2022-12-11 NOTE — Transitions of Care (Post Inpatient/ED Visit) (Signed)
   12/11/2022  Name: Stephanie Bean MRN: [id number] DOB: 12/04/1995  Today's TOC FU Call Status: Today's TOC FU Call Status:: Unsuccessful Call (2nd Attempt) Unsuccessful Call (1st Attempt) Date: 12/07/22 Unsuccessful Call (2nd Attempt) Date: 12/11/22  Attempted to reach the patient regarding the most recent Inpatient/ED visit.  Follow Up Plan: Additional outreach attempts will be made to reach the patient to complete the Transitions of Care (Post Inpatient/ED visit) call.   Signature Tinnie Gens, RN BSN

## 2022-12-14 NOTE — Transitions of Care (Post Inpatient/ED Visit) (Signed)
   12/14/2022  Name: Stephanie Bean MRN: [id number] DOB: 10-15-1995  Today's TOC FU Call Status: Today's TOC FU Call Status:: Unsuccessful Call (3rd Attempt) Unsuccessful Call (1st Attempt) Date: 12/07/22 Unsuccessful Call (2nd Attempt) Date: 12/11/22 Unsuccessful Call (3rd Attempt) Date: 12/14/22  Attempted to reach the patient regarding the most recent Inpatient/ED visit.  Follow Up Plan: No further outreach attempts will be made at this time. We have been unable to contact the patient.  Signature  Tinnie Gens, RN BSN

## 2023-01-25 ENCOUNTER — Telehealth: Payer: Self-pay | Admitting: General Practice

## 2023-01-25 NOTE — Transitions of Care (Post Inpatient/ED Visit) (Signed)
   01/25/2023  Name: Stephanie Bean MRN: [id number] DOB: 30-Oct-1995  Today's TOC FU Call Status: Today's TOC FU Call Status:: Successful TOC FU Call Competed TOC FU Call Complete Date: 01/25/23  Transition Care Management Follow-up Telephone Call Patient has a different PCP. Removed Dr. Ashley Royalty as pcp.   SIGNATURE: Modesto Charon, RN BSN

## 2023-05-18 ENCOUNTER — Ambulatory Visit: Payer: Commercial Managed Care - PPO | Admitting: Internal Medicine

## 2023-06-01 ENCOUNTER — Encounter: Payer: Self-pay | Admitting: Internal Medicine

## 2023-06-01 ENCOUNTER — Ambulatory Visit (INDEPENDENT_AMBULATORY_CARE_PROVIDER_SITE_OTHER): Payer: Commercial Managed Care - PPO | Admitting: Internal Medicine

## 2023-06-01 VITALS — BP 122/80 | HR 93 | Temp 98.1°F | Resp 16 | Ht 67.5 in | Wt 203.8 lb

## 2023-06-01 DIAGNOSIS — J3089 Other allergic rhinitis: Secondary | ICD-10-CM | POA: Diagnosis not present

## 2023-06-01 DIAGNOSIS — L2084 Intrinsic (allergic) eczema: Secondary | ICD-10-CM

## 2023-06-01 DIAGNOSIS — T63481A Toxic effect of venom of other arthropod, accidental (unintentional), initial encounter: Secondary | ICD-10-CM

## 2023-06-01 DIAGNOSIS — J302 Other seasonal allergic rhinitis: Secondary | ICD-10-CM

## 2023-06-01 DIAGNOSIS — F17290 Nicotine dependence, other tobacco product, uncomplicated: Secondary | ICD-10-CM

## 2023-06-01 DIAGNOSIS — T781XXA Other adverse food reactions, not elsewhere classified, initial encounter: Secondary | ICD-10-CM | POA: Diagnosis not present

## 2023-06-01 DIAGNOSIS — R519 Headache, unspecified: Secondary | ICD-10-CM

## 2023-06-01 MED ORDER — CROMOLYN SODIUM 4 % OP SOLN: 1.0000 [drp] | Freq: Four times a day (QID) | OPHTHALMIC | 12 refills | Status: AC

## 2023-06-01 MED ORDER — AZELASTINE HCL 0.1 % NA SOLN: 2.0000 | Freq: Two times a day (BID) | NASAL | 12 refills | Status: AC

## 2023-06-01 MED ORDER — FLUTICASONE PROPIONATE 50 MCG/ACT NA SUSP: 2.0000 | Freq: Every day | NASAL | 2 refills | Status: AC

## 2023-06-01 MED ORDER — EPINEPHRINE 0.3 MG/0.3ML IJ SOAJ: 0.3000 mg | INTRAMUSCULAR | 1 refills | Status: AC | PRN

## 2023-06-01 NOTE — Patient Instructions (Addendum)
Chronic Rhinitis Seasonal and Perennial Allergic: - allergy testing today: Positive grass pollen, weed pollen, mold, dust mite, cockroach  - Prevention:  - allergen avoidance when possible - consider allergy shots as long term control of your symptoms by teaching your immune system to be more tolerant of your allergy triggers  - Symptom control: - Start Nasal Steroid Spray: Best results if used daily. - Options include Flonase (fluticasone), Nasocort (triamcinolone), Nasonex (mometasome) 1- 2 sprays in each nostril daily.  - All can be purchased over-the-counter if not covered by insurance. - Start Astelin (azelastine) 1-2 sprays in each nostril twice a day as needed for nasal congestion/itchy nose  - Discontinue if nightmares of behavior changes. - Continue Antihistamine: daily or daily as needed.   -Options include Zyrtec (Cetirizine) 10mg , Claritin (Loratadine) 10mg , Allegra (Fexofenadine) 180mg , or Xyzal (Levocetirinze) 5mg  - Can be purchased over-the-counter if not covered by insurance.  Allergic Conjunctivitis:  - Start Allergy Eye drops-Cromolyn-1 drop each eye up to 4 times daily as needed and Rewetting Drops such as Systane,TheraTears, etc  -Avoid eye drops that say red eye relief as they may contain medications that dry out your eyes.  Atopic Dermatitis:  Daily Care For Maintenance (daily and continue even once eczema controlled) - Recommend hypoallergenic hydrating ointment at least twice daily.  This must be done daily for control of flares. (Great options include Vaseline, CeraVe, Aquaphor, Aveeno, Cetaphil, VaniCream, etc) - Recommend avoiding detergents, soaps or lotions with fragrances/dyes, and instead using products which are hypoallergenic, use second rinse cycle when washing clothes -Wear lose breathable clothing, avoid wool -Avoid extremes of humidity - Limit showers/baths to 5 minutes and use luke warm water instead of hot, pat dry following baths, and apply  moisturizer - can use steroid creams as detailed below up to twice weekly for prevention of flares.  For Flares:(add this to maintenance therapy if needed for flares) - Triamcinolone 0.1% to body for moderate flares-apply topically twice daily to red, raised areas of skin, followed by moisturizer - Hydrocortisone 2.5% to face, armpit or groin-apply topically twice daily to red, raised areas of skin, followed by moisturizer   Food allergy:  - today's skin testing was negative to all foods, we will recheck with blood work  - please strictly avoid seafood, pinapple and mushrooms for now  - for SKIN only reaction, okay to take Benadryl 2 capsules every 6 hours - for SKIN + ANY additional symptoms, OR IF concern for LIFE THREATENING reaction = Epipen Autoinjector EpiPen 0.3 mg. - If using Epinephrine autoinjector, call 911 - A food allergy action plan has been provided and discussed. - Medic Alert identification is recommended.  Insect Reaction: - You had a large local reaction to insect sting/bite based on history  - your risk of a severe systemic reaction remains low but is slightly increased from the general population and we do not need to do anything different at this time - based on shared decision making, we have prescribed an Epipen to be used if stung and develop ANY ADDITIONAL symptoms besides skin findings (example-nausea, vomiting, diarrhea, lightheadedness, trouble breathing, etc). - if you are stung again and have other symptoms besides hives or localized swelling, please let us know as that would change our management   Follow up: 3 months   Thank you so much for letting me partake in your care today.  Don't hesitate to reach out if you have any additional concerns!  Stephanie Luz, MD  [facility]  [place], [place]  Reducing Pollen Exposure  The American Academy of Allergy, Asthma and Immunology suggests the following steps to reduce your exposure to pollen  during allergy seasons.    Do not hang sheets or clothing out to dry; pollen may collect on these items. Do not mow lawns or spend time around freshly cut grass; mowing stirs up pollen. Keep windows closed at night.  Keep car windows closed while driving. Minimize morning activities outdoors, a time when pollen counts are usually at their highest. Stay indoors as much as possible when pollen counts or humidity is high and on windy days when pollen tends to remain in the air longer. Use air conditioning when possible.  Many air conditioners have filters that trap the pollen spores. Use a HEPA room air filter to remove pollen form the indoor air you breathe.  DUST MITE AVOIDANCE MEASURES:  There are three main measures that need and can be taken to avoid house dust mites:  Reduce accumulation of dust in general -reduce furniture, clothing, carpeting, books, stuffed animals, especially in bedroom  Separate yourself from the dust -use pillow and mattress encasements (can be found at stores such as [employer] or online) -avoid direct exposure to air condition flow -use a HEPA filter device, especially in the bedroom; you can also use a HEPA filter vacuum cleaner -wipe dust with a moist towel instead of a dry towel or broom when cleaning  Decrease mites and/or their secretions -wash clothing and linen and stuffed animals at highest temperature possible, at least every 2 weeks -stuffed animals can also be placed in a bag and put in a freezer overnight  Despite the above measures, it is impossible to eliminate dust mites or their allergen completely from your home.  With the above measures the burden of mites in your home can be diminished, with the goal of minimizing your allergic symptoms.  Success will be reached only when implementing and using all means together.  Control of Cockroach Allergen  Cockroach allergen has been identified as an important cause of acute attacks of  asthma, especially in urban settings.  There are fifty-five species of cockroach that exist in the [place], however only three, the [place], [place] species produce allergen that can affect patients with Asthma.  Allergens can be obtained from fecal particles, egg casings and secretions from cockroaches.    Remove food sources. Reduce access to water. Seal access and entry points. Spray runways with 0.5-1% Diazinon or Chlorpyrifos Blow boric acid power under stoves and refrigerator. Place bait stations (hydramethylnon) at feeding sites.  Control of Mold Allergen   Mold and fungi can grow on a variety of surfaces provided certain temperature and moisture conditions exist.  Outdoor molds grow on plants, decaying vegetation and soil.  The major outdoor mold, Alternaria and Cladosporium, are found in very high numbers during hot and dry conditions.  Generally, a late Summer - Fall peak is seen for common outdoor fungal spores.  Rain will temporarily lower outdoor mold spore count, but counts rise rapidly when the rainy period ends.  The most important indoor molds are Aspergillus and Penicillium.  Dark, humid and poorly ventilated basements are ideal sites for mold growth.  The next most common sites of mold growth are the bathroom and the kitchen.  Outdoor (Seasonal) Mold Control  Use air conditioning and keep windows closed Avoid exposure to decaying vegetation. Avoid leaf raking. Avoid grain handling. Consider wearing a face mask  if working in moldy areas.    Indoor (Perennial) Mold Control   Maintain humidity below 50%. Clean washable surfaces with 5% bleach solution. Remove sources e.g. contaminated carpets.
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
## 2023-06-05 LAB — ALLERGY PANEL 19, SEAFOOD GROUP
Allergen Salmon IgE: <0.1 kU/L
Catfish: <0.1 kU/L
Codfish IgE: <0.1 kU/L
F023-IgE Crab: 0.58 kU/L — AB
F080-IgE Lobster: 0.18 kU/L — AB
Shrimp IgE: 0.87 kU/L — AB
Tuna: <0.1 kU/L

## 2023-06-05 LAB — ALLERGEN, PINEAPPLE, F210: Pineapple IgE: 0.42 kU/L — AB

## 2023-06-05 LAB — ALLERGEN, MUSHROOM, RF212: Mushroom IgE: <0.1 kU/L

## 2023-06-20 NOTE — Progress Notes (Signed)
Labs returned low positive to pineapple, and shellfish. Negative to mushroom.  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    Oral Allergy Syndrome: - Your symptoms are not consistent with true food allergies, and are more likely to be due to oral allergy syndrome. - These symptoms are not life-threatening and are because of a cross reaction between a pollen or dust mite you are allergic to, and to a protein in specific foods (such as fresh fruits, vegetables, shellfish and nuts). - If you can eat these things it is fine to continue to do so.  If not, you may avoid these fresh fruits.  Heating these foods should allow them to be consumed without symptoms. - You may notice increase in symptoms during allergy season, this is to be expected. - Allergy  Immunotherapy can help lessen and some cases cure these symptoms and should be considered if they worsen.

## 2023-11-09 ENCOUNTER — Other Ambulatory Visit: Payer: Self-pay | Admitting: Internal Medicine

## 2024-09-22 ENCOUNTER — Telehealth: Payer: Self-pay

## 2024-09-22 ENCOUNTER — Other Ambulatory Visit: Payer: Self-pay | Admitting: Internal Medicine

## 2024-09-22 NOTE — Telephone Encounter (Signed)
 Received [facility] request for epi refill patient last seen 06/01/2023 sent in denial patient needs visit.
